# Patient Record
Sex: Male | Born: 1955 | Race: Black or African American | Hispanic: No | Marital: Married | Smoking: Never smoker
Health system: Southern US, Community
[De-identification: ages and names within clinical notes are randomized; demographics above are authoritative.]

## PROBLEM LIST (undated history)

## (undated) DIAGNOSIS — I10 Essential (primary) hypertension: Secondary | ICD-10-CM

## (undated) DIAGNOSIS — E7800 Pure hypercholesterolemia, unspecified: Secondary | ICD-10-CM

## (undated) DIAGNOSIS — N289 Disorder of kidney and ureter, unspecified: Secondary | ICD-10-CM

## (undated) HISTORY — PX: LYMPH GLAND EXCISION: SHX13

## (undated) HISTORY — PX: TOTAL HIP ARTHROPLASTY: SHX124

---

## 2012-01-14 ENCOUNTER — Emergency Department (HOSPITAL_COMMUNITY): Payer: BC Managed Care – PPO

## 2012-01-14 ENCOUNTER — Emergency Department (HOSPITAL_COMMUNITY)
Admission: EM | Admit: 2012-01-14 | Discharge: 2012-01-14 | Disposition: A | Discharge status: Home / Self Care | Payer: BC Managed Care – PPO | Attending: Emergency Medicine | Admitting: Emergency Medicine

## 2012-01-14 ENCOUNTER — Encounter (HOSPITAL_COMMUNITY): Payer: Self-pay | Admitting: *Deleted

## 2012-01-14 DIAGNOSIS — R109 Unspecified abdominal pain: Secondary | ICD-10-CM | POA: Insufficient documentation

## 2012-01-14 DIAGNOSIS — R110 Nausea: Secondary | ICD-10-CM | POA: Insufficient documentation

## 2012-01-14 DIAGNOSIS — N390 Urinary tract infection, site not specified: Secondary | ICD-10-CM | POA: Insufficient documentation

## 2012-01-14 DIAGNOSIS — R6883 Chills (without fever): Secondary | ICD-10-CM | POA: Insufficient documentation

## 2012-01-14 DIAGNOSIS — R10819 Abdominal tenderness, unspecified site: Secondary | ICD-10-CM | POA: Insufficient documentation

## 2012-01-14 DIAGNOSIS — Z87442 Personal history of urinary calculi: Secondary | ICD-10-CM | POA: Insufficient documentation

## 2012-01-14 HISTORY — DX: Disorder of kidney and ureter, unspecified: N28.9

## 2012-01-14 LAB — URINALYSIS, ROUTINE W REFLEX MICROSCOPIC
Bilirubin Urine: NEGATIVE
Glucose, UA: NEGATIVE mg/dL
Protein, ur: NEGATIVE mg/dL
Specific Gravity, Urine: 1.02 (ref 1.005–1.030)

## 2012-01-14 LAB — URINE MICROSCOPIC-ADD ON

## 2012-01-14 MED ORDER — CEFTRIAXONE SODIUM 1 G IJ SOLR
1.0000 g | Freq: Once | INTRAMUSCULAR | Status: AC
  Administered 2012-01-14: 1 g via INTRAMUSCULAR
  Filled 2012-01-14: qty 10

## 2012-01-14 MED ORDER — TRAMADOL HCL 50 MG PO TABS: 50.0000 mg | ORAL_TABLET | Freq: Four times a day (QID) | ORAL | Status: AC | PRN

## 2012-01-14 MED ORDER — CEPHALEXIN 500 MG PO CAPS: 500.0000 mg | ORAL_CAPSULE | Freq: Four times a day (QID) | ORAL | Status: AC

## 2012-01-14 MED ORDER — ONDANSETRON 4 MG PO TBDP
4.0000 mg | ORAL_TABLET | Freq: Once | ORAL | Status: AC
  Administered 2012-01-14: 4 mg via ORAL
  Filled 2012-01-14: qty 1

## 2012-01-14 MED ORDER — KETOROLAC TROMETHAMINE 30 MG/ML IJ SOLN: 30.0000 mg | Freq: Once | INTRAMUSCULAR | Status: DC

## 2012-01-14 MED ORDER — PROMETHAZINE HCL 25 MG PO TABS: 25.0000 mg | ORAL_TABLET | Freq: Four times a day (QID) | ORAL | Status: AC | PRN

## 2012-01-14 NOTE — Discharge Instructions (Signed)
Drink plenty of fluids.  Follow up with your md in 1-2 weeks.  Return if problems

## 2012-01-14 NOTE — ED Notes (Signed)
Left flank pain x 2 weeks. Hx of kidney stones, states that symptoms feel the same. Nausea.

## 2012-01-14 NOTE — ED Notes (Signed)
Attempted to start IV and draw labs. Pt refused stating "I dont want this to take very long. I just want an xray to tell me where the stone is and I want a shot to break it up." Primary RN Enid Derry RN notified.

## 2012-01-14 NOTE — ED Provider Notes (Cosign Needed)
History  Scribed for [name], MD, the patient was seen in room APA11/[facility]11. This chart was scribed by Candelaria Stagers. The patient's care started at 11:34 AM    CSN: [id number]  Arrival date & time 01/14/12  1044   First MD Initiated Contact with Patient 01/14/12 1121      Chief Complaint  Patient presents with  . Flank Pain     Patient is a 56 y.o. male presenting with flank pain. The history is provided by the patient.  Flank Pain This is a new problem. The current episode started more than 1 week ago. The problem occurs constantly. The problem has not changed since onset.Associated symptoms include abdominal pain. The symptoms are aggravated by nothing. The symptoms are relieved by nothing. He has tried nothing for the symptoms. The treatment provided no relief.   Jason Cole is a 56 y.o. male who presents to the Emergency Department complaining of constant flank pain on the left side that stated about two weeks ago.  He is also experiencing radiating pain to the abdomen, nausea, and chills.  He has h/o kidney stones and reports that these sx are similar.  Nothing seems to make the pain better or worse.      Past Medical History  Diagnosis Date  . Renal disorder     kidney stones.    Past Surgical History  Procedure Date  . Lymph gland excision     No family history on file.  History  Substance Use Topics  . Smoking status: Never Smoker   . Smokeless tobacco: Not on file  . Alcohol Use: No      Review of Systems  Constitutional: Positive for chills.  Gastrointestinal: Positive for nausea and abdominal pain.  Genitourinary: Positive for flank pain (left side).  All other systems reviewed and are negative.    Allergies  Review of patient's allergies indicates no known allergies.  Home Medications  No current outpatient prescriptions on file.  BP 152/85  Pulse 85  Temp(Src) 98.5 F (36.9 C) (Oral)  Resp 20  Ht 5\' 11"  (1.803 m)  Wt 225 lb  (102.059 kg)  BMI 31.38 kg/m2  SpO2 100%  Physical Exam  Nursing note and vitals reviewed. Constitutional: He is oriented to person, place, and time. He appears well-developed and well-nourished. No distress.  HENT:  Head: Normocephalic and atraumatic.  Eyes: EOM are normal. Right eye exhibits no discharge. Left eye exhibits no discharge.  Neck: Normal range of motion. Neck supple.  Pulmonary/Chest: Effort normal. No respiratory distress.  Abdominal:       Mild tenderness to right flank.   Musculoskeletal: He exhibits no edema and no tenderness.  Neurological: He is alert and oriented to person, place, and time.  Skin: Skin is warm and dry. He is not diaphoretic.  Psychiatric: He has a normal mood and affect. His behavior is normal.    ED Course  Procedures  DIAGNOSTIC STUDIES: Oxygen Saturation is 100% on room air, normal by my interpretation.    COORDINATION OF CARE: 11:39AM Ordered: URINALYSIS, ROUTINE W REFLEX MICROSCOPIC, BASIC METABOLIC PANEL, DIFFERENTIAL, CBC  1:23 PM Recheck: discussed results of test course of care with pt.  Pt did not want blood drawn.    No results found for this or any previous visit. Ct Abdomen Pelvis Wo Contrast  01/14/2012  *RADIOLOGY REPORT*  Clinical Data: 2-week history of left flank pain.  History of urinary tract calculi.  CT ABDOMEN AND PELVIS WITHOUT CONTRAST 01/14/2012:  Technique:  Multidetector CT imaging of the abdomen and pelvis was performed following the standard protocol without intravenous contrast.  Comparison: None.  Findings: No evidence of urinary tract calculi or obstruction on either side.  Slightly edematous left kidney relative to the right, with increased perinephric stranding/edema involving the left kidney.  Within the limits of the unenhanced technique, no focal parenchymal abnormality involving either kidney.  Normal low dose unenhanced appearance of the liver, spleen, pancreas, and adrenal glands.  Gallbladder contracted  as there is food within the normal-appearing stomach.  No biliary ductal dilation.  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  Scattered normal- sized retroperitoneal lymph nodes, the largest approximating 0.8 x 1.8 cm (series 2, image 36); no nodal masses.  Normal-appearing small bowel.  Moderate stool burden throughout normal appearing colon.  Normal appendix in the right upper pelvis.  No ascites.  Urinary bladder decompressed and unremarkable.  Numerous pelvic phleboliths.  Prostate gland and seminal vesicles normal for age. Bone window images demonstrate mild degenerative changes in the hips and mild lower thoracic spondylosis.  Visualized lung bases clear.  IMPRESSION:  1.  No evidence of urinary tract calculi or obstruction on either side. 2.  Edematous left kidney relative to the right.  Is there clinical evidence for pyelonephritis? 3.  No acute abnormalities otherwise. 4.  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  Original Report Authenticated By: Arnell Sieving, M.D.     Labs Reviewed  URINALYSIS, ROUTINE W REFLEX MICROSCOPIC - Abnormal; Notable for the following:    Hgb urine dipstick SMALL (*)    All other components within normal limits  URINE MICROSCOPIC-ADD ON - Abnormal; Notable for the following:    Bacteria, UA MANY (*)    All other components within normal limits   No results found.   No diagnosis found. Pt refused blood work   MDM  uti The chart was scribed for me under my direct supervision.  I personally performed the history, physical, and medical decision making and all procedures in the evaluation of this patient.[name], MD 01/14/12 1330

## 2012-01-14 NOTE — ED Notes (Signed)
Pt refused lab work, IV and pain medication again. Did agree to have CT

## 2012-01-16 LAB — URINE CULTURE

## 2012-01-17 NOTE — ED Notes (Signed)
+   Urine Patient treated with Keflex-sensitive to same-chart appended per protocol MD. 

## 2017-07-22 ENCOUNTER — Encounter (HOSPITAL_COMMUNITY): Payer: Self-pay | Admitting: Emergency Medicine

## 2017-07-22 ENCOUNTER — Emergency Department (HOSPITAL_COMMUNITY)
Admission: EM | Admit: 2017-07-22 | Discharge: 2017-07-22 | Disposition: A | Discharge status: Home / Self Care | Payer: 59 | Attending: Emergency Medicine | Admitting: Emergency Medicine

## 2017-07-22 DIAGNOSIS — Z79899 Other long term (current) drug therapy: Secondary | ICD-10-CM | POA: Insufficient documentation

## 2017-07-22 DIAGNOSIS — T7840XA Allergy, unspecified, initial encounter: Secondary | ICD-10-CM | POA: Insufficient documentation

## 2017-07-22 DIAGNOSIS — R600 Localized edema: Secondary | ICD-10-CM | POA: Diagnosis present

## 2017-07-22 DIAGNOSIS — Z7902 Long term (current) use of antithrombotics/antiplatelets: Secondary | ICD-10-CM | POA: Insufficient documentation

## 2017-07-22 DIAGNOSIS — I10 Essential (primary) hypertension: Secondary | ICD-10-CM

## 2017-07-22 DIAGNOSIS — H1033 Unspecified acute conjunctivitis, bilateral: Secondary | ICD-10-CM | POA: Insufficient documentation

## 2017-07-22 HISTORY — DX: Pure hypercholesterolemia, unspecified: E78.00

## 2017-07-22 HISTORY — DX: Essential (primary) hypertension: I10

## 2017-07-22 MED ORDER — FAMOTIDINE IN NACL 20-0.9 MG/50ML-% IV SOLN
20.0000 mg | Freq: Once | INTRAVENOUS | Status: AC
  Administered 2017-07-22: 20 mg via INTRAVENOUS
  Filled 2017-07-22: qty 50

## 2017-07-22 MED ORDER — AMLODIPINE BESYLATE 5 MG PO TABS: 5.0000 mg | ORAL_TABLET | Freq: Every day | ORAL | 1 refills | Status: AC

## 2017-07-22 MED ORDER — SODIUM CHLORIDE 0.9 % IV BOLUS (SEPSIS)
1000.0000 mL | Freq: Once | INTRAVENOUS | Status: AC
  Administered 2017-07-22: 1000 mL via INTRAVENOUS

## 2017-07-22 MED ORDER — METHYLPREDNISOLONE SODIUM SUCC 125 MG IJ SOLR
125.0000 mg | Freq: Once | INTRAMUSCULAR | Status: AC
  Administered 2017-07-22: 125 mg via INTRAVENOUS
  Filled 2017-07-22: qty 2

## 2017-07-22 MED ORDER — DIPHENHYDRAMINE HCL 50 MG/ML IJ SOLN
25.0000 mg | Freq: Once | INTRAMUSCULAR | Status: AC
  Administered 2017-07-22: 25 mg via INTRAVENOUS
  Filled 2017-07-22: qty 1

## 2017-07-22 MED ORDER — TOBRAMYCIN 0.3 % OP SOLN
2.0000 [drp] | Freq: Once | OPHTHALMIC | Status: AC
  Administered 2017-07-22: 2 [drp] via OPHTHALMIC
  Filled 2017-07-22: qty 5

## 2017-07-22 NOTE — Discharge Instructions (Signed)
Stop lisinopril. Start new blood pressure medication. For your eyes, rinse with tap water, then apply drops. Follow-up your primary care doctor or return if worse

## 2017-07-22 NOTE — ED Notes (Signed)
ED Provider at bedside. 

## 2017-07-22 NOTE — ED Triage Notes (Signed)
Pt reports bilateral eyelid swelling yesterday and upper lip swelling since this am. Airway patent. nad noted.

## 2017-07-23 NOTE — ED Provider Notes (Signed)
[facility]-EMERGENCY DEPT Provider Note   CSN: [id number] Arrival date & time: 07/22/17  1018     History   Chief Complaint Chief Complaint  Patient presents with  . Oral Swelling    HPI Jason Cole is a 61 y.o. male.  Complains of upper lip swelling and bilateral eye redness with crusty discharge for the past 24 hrs. Patient is on lisinopril for his blood pressure.  No other known allergens or inciting events. Normal swallowing. Normal airway. Respirations normal.  Severity of symptoms is moderate. Nothing makes symptoms better or worse.      Past Medical History:  Diagnosis Date  . High cholesterol   . Hypertension   . Renal disorder    kidney stones.    There are no active problems to display for this patient.   Past Surgical History:  Procedure Laterality Date  . LYMPH GLAND EXCISION         Home Medications    Prior to Admission medications   Medication Sig Start Date End Date Taking? Authorizing Provider  aspirin EC 81 MG tablet Take 81 mg by mouth daily as needed for moderate pain.    Yes [provider]  ibuprofen (ADVIL,MOTRIN) 200 MG tablet Take 200-400 mg by mouth every 6 (six) hours as needed for moderate pain.   Yes [provider]  lisinopril-hydrochlorothiazide (PRINZIDE,ZESTORETIC) 10-12.5 MG tablet Take 1 tablet by mouth daily.   Yes [provider]  Multiple Vitamins-Minerals (ONE-A-DAY 50 PLUS PO) Take 1 tablet by mouth daily.   Yes [provider]  pravastatin (PRAVACHOL) 40 MG tablet Take 40 mg by mouth daily.   Yes [provider]  amLODipine (NORVASC) 5 MG tablet Take 1 tablet (5 mg total) by mouth daily. 07/22/17   Donnetta Hutching, MD    Family History History reviewed. No pertinent family history.  Social History Social History  Substance Use Topics  . Smoking status: Never Smoker  . Smokeless tobacco: Never Used  . Alcohol use No     Allergies   Patient has no known  allergies.   Review of Systems Review of Systems  All other systems reviewed and are negative.    Physical Exam Updated Vital Signs BP 113/74   Pulse 60   Temp 98.9 F (37.2 C) (Oral)   Resp 18   Ht  (1.803 m)   Wt 97.5 kg (215 lb)   SpO2 98%   BMI 29.99 kg/m   Physical Exam  Constitutional: He is oriented to person, place, and time. He appears well-developed and well-nourished.  HENT:  Significant upper lip swelling  Eyes:  Conjunctiva red, inflamed:  Crusty discharge  Neck: Neck supple.  Cardiovascular: Normal rate and regular rhythm.   Pulmonary/Chest: Effort normal and breath sounds normal.  Abdominal: Soft. Bowel sounds are normal.  Musculoskeletal: Normal range of motion.  Neurological: He is alert and oriented to person, place, and time.  Skin: Skin is warm and dry.  Psychiatric: He has a normal mood and affect. His behavior is normal.  Nursing note and vitals reviewed.    ED Treatments / Results  Labs (all labs ordered are listed, but only abnormal results are displayed) Labs Reviewed - No data to display  EKG  EKG Interpretation None       Radiology No results found.  Procedures Procedures (including critical care time)  Medications Ordered in ED Medications  sodium chloride 0.9 % bolus 1,000 mL (0 mLs Intravenous Stopped 07/22/17 1338)  methylPREDNISolone  sodium succinate (SOLU-MEDROL) 125 mg/2 mL injection 125 mg (125 mg Intravenous Given 07/22/17 1100)  famotidine (PEPCID) IVPB 20 mg premix (0 mg Intravenous Stopped 07/22/17 1148)  diphenhydrAMINE (BENADRYL) injection 25 mg (25 mg Intravenous Given 07/22/17 1100)  tobramycin (TOBREX) 0.3 % ophthalmic solution 2 drop (2 drops Both Eyes Given 07/22/17 1100)     Initial Impression / Assessment and Plan / ED Course  I have reviewed the triage vital signs and the nursing notes.  Pertinent labs & imaging results that were available during my care of the patient were reviewed by me and  considered in my medical decision making (see chart for details).     Suspect upper lip swelling secondary to ACE inhibitor. Patient was given the usual cocktails of steroids, Benadryl, Pepcid. Will discontinue Lisinopril and start a new antihypertensive Norvasc.  Additionally, will Rx for suspected severe conjunctivitis with tobramycin ophthalmic.  Final Clinical Impressions(s) / ED Diagnoses   Final diagnoses:  Allergic reaction, initial encounter  Hypertension, unspecified type  Acute conjunctivitis of both eyes, unspecified acute conjunctivitis type    New Prescriptions Discharge Medication List as of 07/22/2017  2:15 PM    START taking these medications   Details  amLODipine (NORVASC) 5 MG tablet Take 1 tablet (5 mg total) by mouth daily., Starting Sat 07/22/2017, Print         Donnetta Hutching, MD 07/23/17 [phone]

## 2017-08-07 DIAGNOSIS — Z23 Encounter for immunization: Secondary | ICD-10-CM | POA: Diagnosis not present

## 2017-12-11 DIAGNOSIS — I10 Essential (primary) hypertension: Secondary | ICD-10-CM | POA: Diagnosis not present

## 2017-12-11 DIAGNOSIS — E782 Mixed hyperlipidemia: Secondary | ICD-10-CM | POA: Diagnosis not present

## 2017-12-11 DIAGNOSIS — Z6831 Body mass index (BMI) 31.0-31.9, adult: Secondary | ICD-10-CM | POA: Diagnosis not present

## 2017-12-11 DIAGNOSIS — R0683 Snoring: Secondary | ICD-10-CM | POA: Diagnosis not present

## 2017-12-11 DIAGNOSIS — E6609 Other obesity due to excess calories: Secondary | ICD-10-CM | POA: Diagnosis not present

## 2017-12-12 DIAGNOSIS — I10 Essential (primary) hypertension: Secondary | ICD-10-CM | POA: Diagnosis not present

## 2017-12-12 DIAGNOSIS — R0683 Snoring: Secondary | ICD-10-CM | POA: Diagnosis not present

## 2017-12-12 DIAGNOSIS — E782 Mixed hyperlipidemia: Secondary | ICD-10-CM | POA: Diagnosis not present

## 2017-12-12 DIAGNOSIS — Z6831 Body mass index (BMI) 31.0-31.9, adult: Secondary | ICD-10-CM | POA: Diagnosis not present

## 2017-12-14 DIAGNOSIS — R0683 Snoring: Secondary | ICD-10-CM | POA: Diagnosis not present

## 2018-01-16 DIAGNOSIS — G4733 Obstructive sleep apnea (adult) (pediatric): Secondary | ICD-10-CM | POA: Diagnosis not present

## 2018-01-17 DIAGNOSIS — G4733 Obstructive sleep apnea (adult) (pediatric): Secondary | ICD-10-CM | POA: Diagnosis not present

## 2018-02-02 DIAGNOSIS — G4733 Obstructive sleep apnea (adult) (pediatric): Secondary | ICD-10-CM | POA: Diagnosis not present

## 2018-03-04 DIAGNOSIS — G4733 Obstructive sleep apnea (adult) (pediatric): Secondary | ICD-10-CM | POA: Diagnosis not present

## 2018-04-04 DIAGNOSIS — G4733 Obstructive sleep apnea (adult) (pediatric): Secondary | ICD-10-CM | POA: Diagnosis not present

## 2018-05-21 DIAGNOSIS — E6609 Other obesity due to excess calories: Secondary | ICD-10-CM | POA: Diagnosis not present

## 2018-05-21 DIAGNOSIS — I10 Essential (primary) hypertension: Secondary | ICD-10-CM | POA: Diagnosis not present

## 2018-05-21 DIAGNOSIS — Z6831 Body mass index (BMI) 31.0-31.9, adult: Secondary | ICD-10-CM | POA: Diagnosis not present

## 2018-05-21 DIAGNOSIS — Z1389 Encounter for screening for other disorder: Secondary | ICD-10-CM | POA: Diagnosis not present

## 2018-09-05 ENCOUNTER — Other Ambulatory Visit (HOSPITAL_COMMUNITY): Payer: Self-pay | Admitting: Family Medicine

## 2018-09-05 ENCOUNTER — Ambulatory Visit (HOSPITAL_COMMUNITY)
Admission: RE | Admit: 2018-09-05 | Discharge: 2018-09-05 | Disposition: A | Discharge status: Home / Self Care | Payer: BLUE CROSS/BLUE SHIELD | Source: Ambulatory Visit | Attending: Family Medicine | Admitting: Family Medicine

## 2018-09-05 DIAGNOSIS — M25552 Pain in left hip: Secondary | ICD-10-CM | POA: Diagnosis not present

## 2018-09-05 DIAGNOSIS — M25551 Pain in right hip: Secondary | ICD-10-CM | POA: Diagnosis not present

## 2018-09-05 DIAGNOSIS — Z23 Encounter for immunization: Secondary | ICD-10-CM | POA: Diagnosis not present

## 2018-09-05 DIAGNOSIS — M160 Bilateral primary osteoarthritis of hip: Secondary | ICD-10-CM | POA: Diagnosis not present

## 2018-09-05 DIAGNOSIS — Z6831 Body mass index (BMI) 31.0-31.9, adult: Secondary | ICD-10-CM | POA: Diagnosis not present

## 2018-09-05 DIAGNOSIS — Z1389 Encounter for screening for other disorder: Secondary | ICD-10-CM | POA: Diagnosis not present

## 2018-09-05 DIAGNOSIS — E6609 Other obesity due to excess calories: Secondary | ICD-10-CM | POA: Diagnosis not present

## 2018-12-17 DIAGNOSIS — M5412 Radiculopathy, cervical region: Secondary | ICD-10-CM | POA: Diagnosis not present

## 2018-12-17 DIAGNOSIS — Z6831 Body mass index (BMI) 31.0-31.9, adult: Secondary | ICD-10-CM | POA: Diagnosis not present

## 2018-12-17 DIAGNOSIS — Z1389 Encounter for screening for other disorder: Secondary | ICD-10-CM | POA: Diagnosis not present

## 2018-12-17 DIAGNOSIS — E6609 Other obesity due to excess calories: Secondary | ICD-10-CM | POA: Diagnosis not present

## 2018-12-17 DIAGNOSIS — L910 Hypertrophic scar: Secondary | ICD-10-CM | POA: Diagnosis not present

## 2019-01-07 DIAGNOSIS — L910 Hypertrophic scar: Secondary | ICD-10-CM | POA: Diagnosis not present

## 2019-01-31 ENCOUNTER — Ambulatory Visit: Payer: BLUE CROSS/BLUE SHIELD

## 2019-01-31 ENCOUNTER — Ambulatory Visit
Admission: RE | Admit: 2019-01-31 | Discharge: 2019-01-31 | Disposition: A | Discharge status: Home / Self Care | Payer: BLUE CROSS/BLUE SHIELD | Source: Ambulatory Visit | Attending: Radiation Oncology | Admitting: Radiation Oncology

## 2019-02-20 DIAGNOSIS — I10 Essential (primary) hypertension: Secondary | ICD-10-CM | POA: Diagnosis not present

## 2019-02-20 DIAGNOSIS — Z1389 Encounter for screening for other disorder: Secondary | ICD-10-CM | POA: Diagnosis not present

## 2019-02-20 DIAGNOSIS — D649 Anemia, unspecified: Secondary | ICD-10-CM | POA: Diagnosis not present

## 2019-02-20 DIAGNOSIS — M2550 Pain in unspecified joint: Secondary | ICD-10-CM | POA: Diagnosis not present

## 2019-02-20 DIAGNOSIS — Z681 Body mass index (BMI) 19 or less, adult: Secondary | ICD-10-CM | POA: Diagnosis not present

## 2019-02-20 DIAGNOSIS — M1991 Primary osteoarthritis, unspecified site: Secondary | ICD-10-CM | POA: Diagnosis not present

## 2019-02-20 DIAGNOSIS — E7849 Other hyperlipidemia: Secondary | ICD-10-CM | POA: Diagnosis not present

## 2019-08-02 DIAGNOSIS — Z23 Encounter for immunization: Secondary | ICD-10-CM | POA: Diagnosis not present

## 2019-08-20 DIAGNOSIS — M1611 Unilateral primary osteoarthritis, right hip: Secondary | ICD-10-CM | POA: Diagnosis not present

## 2019-08-20 DIAGNOSIS — Z6833 Body mass index (BMI) 33.0-33.9, adult: Secondary | ICD-10-CM | POA: Diagnosis not present

## 2019-08-20 DIAGNOSIS — G5681 Other specified mononeuropathies of right upper limb: Secondary | ICD-10-CM | POA: Diagnosis not present

## 2019-08-20 DIAGNOSIS — I10 Essential (primary) hypertension: Secondary | ICD-10-CM | POA: Diagnosis not present

## 2019-09-04 ENCOUNTER — Emergency Department (HOSPITAL_COMMUNITY): Payer: BC Managed Care – PPO

## 2019-09-04 ENCOUNTER — Other Ambulatory Visit: Payer: Self-pay

## 2019-09-04 ENCOUNTER — Encounter (HOSPITAL_COMMUNITY): Payer: Self-pay | Admitting: *Deleted

## 2019-09-04 ENCOUNTER — Emergency Department (HOSPITAL_COMMUNITY)
Admission: EM | Admit: 2019-09-04 | Discharge: 2019-09-04 | Disposition: A | Discharge status: Home / Self Care | Payer: BC Managed Care – PPO | Attending: Emergency Medicine | Admitting: Emergency Medicine

## 2019-09-04 DIAGNOSIS — Z79899 Other long term (current) drug therapy: Secondary | ICD-10-CM | POA: Insufficient documentation

## 2019-09-04 DIAGNOSIS — R0789 Other chest pain: Secondary | ICD-10-CM

## 2019-09-04 DIAGNOSIS — U071 COVID-19: Secondary | ICD-10-CM | POA: Diagnosis not present

## 2019-09-04 DIAGNOSIS — R079 Chest pain, unspecified: Secondary | ICD-10-CM | POA: Diagnosis not present

## 2019-09-04 DIAGNOSIS — I10 Essential (primary) hypertension: Secondary | ICD-10-CM | POA: Insufficient documentation

## 2019-09-04 LAB — BASIC METABOLIC PANEL
Anion gap: 7 (ref 5–15)
BUN: 16 mg/dL (ref 8–23)
CO2: 25 mmol/L (ref 22–32)
Calcium: 9 mg/dL (ref 8.9–10.3)
Chloride: 106 mmol/L (ref 98–111)
Creatinine, Ser: 0.96 mg/dL (ref 0.61–1.24)
GFR calc Af Amer: >60 mL/min (ref >60)
GFR calc non Af Amer: >60 mL/min (ref >60)
Glucose, Bld: 95 mg/dL (ref 70–99)
Potassium: 4 mmol/L (ref 3.5–5.1)
Sodium: 138 mmol/L (ref 135–145)

## 2019-09-04 LAB — CBC
HCT: 44.1 % (ref 39.0–52.0)
Hemoglobin: 13.6 g/dL (ref 13.0–17.0)
MCH: 23 pg — ABNORMAL LOW (ref 26.0–34.0)
MCHC: 30.8 g/dL (ref 30.0–36.0)
MCV: 74.6 fL — ABNORMAL LOW (ref 80.0–100.0)
Platelets: 184 10*3/uL (ref 150–400)
RBC: 5.91 MIL/uL — ABNORMAL HIGH (ref 4.22–5.81)
RDW: 14.5 % (ref 11.5–15.5)
WBC: 4.9 10*3/uL (ref 4.0–10.5)
nRBC: 0 % (ref 0.0–0.2)

## 2019-09-04 LAB — TROPONIN I (HIGH SENSITIVITY): Troponin I (High Sensitivity): 3 ng/L (ref <18)

## 2019-09-04 MED ORDER — IBUPROFEN 400 MG PO TABS
400.0000 mg | ORAL_TABLET | Freq: Once | ORAL | Status: AC
  Administered 2019-09-04: 400 mg via ORAL
  Filled 2019-09-04: qty 1

## 2019-09-04 MED ORDER — SODIUM CHLORIDE 0.9% FLUSH: 3.0000 mL | Freq: Once | INTRAVENOUS | Status: DC

## 2019-09-04 NOTE — Discharge Instructions (Addendum)
It was our pleasure to provide your ER care today - we hope that you feel better.  Take ibuprofen and/or acetaminophen as need.   Your chest xray was read as showing a 'convex opacity' at the left lung base, and our radiologist recommends a follow up outpatient CT scan of the chest to further evaluate - follow up with primary care doctor in the next 1-2 weeks, have them review your chest xray and arrange this follow up imaging.   Return to ER if worse, new symptoms, increased trouble breathing, persistent or recurrent chest pain, or other concern.

## 2019-09-04 NOTE — ED Triage Notes (Signed)
Pt in c/o mid CP onset 2-3 days, pt reports recent + COVID test last weds, pt denies n/v/d, pt denies SOB, pt A&O x4

## 2019-09-04 NOTE — ED Notes (Signed)
Pt updated via phone re: continued efforts for a room, pt verbalizes understanding

## 2019-09-04 NOTE — ED Provider Notes (Signed)
[facility] EMERGENCY DEPARTMENT Provider Note   CSN: [id number] Arrival date & time: 09/04/19  1513     History   Chief Complaint Chief Complaint  Patient presents with  . Chest Pain    HPI Luiz Trumpower is a 63 y.o. male.     Patient with recent dx covid+, presents with chest tightness in past several days. Symptoms constant, mild-mod, occur at rest, no relation to exertion or specific position. Not pleuritic. No exertional cp or discomfort. +non productive cough persists, without abrupt or acute worsening. No sore throat. Fevers have improved/resolved. covid test was positive 1 week ago, notes symptom onset 2-3 days before that. Denies leg pain or swelling. No recent immobility, surgery, or trauma. No hx cad. No fam hx premature cad. No hx dvt or pe.   The history is provided by the patient.  Chest Pain Associated symptoms: cough   Associated symptoms: no abdominal pain, no back pain, no diaphoresis, no headache, no palpitations, no shortness of breath and no vomiting     Past Medical History:  Diagnosis Date  . High cholesterol   . Hypertension   . Renal disorder    kidney stones.    There are no active problems to display for this patient.   Past Surgical History:  Procedure Laterality Date  . LYMPH GLAND EXCISION          Home Medications    Prior to Admission medications   Medication Sig Start Date End Date Taking? Authorizing Provider  amLODipine (NORVASC) 5 MG tablet Take 1 tablet (5 mg total) by mouth daily. 07/22/17   Nat Christen, MD  aspirin EC 81 MG tablet Take 81 mg by mouth daily as needed for moderate pain.     [provider]  celecoxib (CELEBREX) 200 MG capsule Take 200 mg by mouth daily as needed. 08/13/19   [provider]  gabapentin (NEURONTIN) 300 MG capsule Take 300 mg by mouth 3 (three) times daily. 08/20/19   [provider]  ibuprofen (ADVIL,MOTRIN) 200 MG tablet Take 200-400 mg by mouth every 6 (six) hours  as needed for moderate pain.    [provider]  lisinopril-hydrochlorothiazide (PRINZIDE,ZESTORETIC) 10-12.5 MG tablet Take 1 tablet by mouth daily.    [provider]  Multiple Vitamins-Minerals (ONE-A-DAY 50 PLUS PO) Take 1 tablet by mouth daily.    [provider]  pravastatin (PRAVACHOL) 40 MG tablet Take 40 mg by mouth daily.    [provider]    Family History No family history on file.  Social History Social History   Tobacco Use  . Smoking status: Never Smoker  . Smokeless tobacco: Never Used  Substance Use Topics  . Alcohol use: No  . Drug use: Not on file     Allergies   Patient has no known allergies.   Review of Systems Review of Systems  Constitutional: Negative for chills and diaphoresis.  HENT: Negative for sore throat.   Eyes: Negative for discharge and redness.  Respiratory: Positive for cough. Negative for shortness of breath.   Cardiovascular: Positive for chest pain. Negative for palpitations and leg swelling.  Gastrointestinal: Negative for abdominal pain, diarrhea and vomiting.  Genitourinary: Negative for flank pain.  Musculoskeletal: Negative for back pain and neck pain.  Skin: Negative for rash.  Neurological: Negative for headaches.  Hematological: Does not bruise/bleed easily.  Psychiatric/Behavioral: Negative for confusion.     Physical Exam Updated Vital Signs BP (!) 154/88 (BP Location: Right Arm)  Pulse 69   Temp (!) 97.3 F (36.3 C) (Oral)   Resp 14   Ht 1.816 m (5' 11.5")   SpO2 100%   BMI 29.57 kg/m   Physical Exam Vitals signs and nursing note reviewed.  Constitutional:      Appearance: Normal appearance. He is well-developed.  HENT:     Head: Atraumatic.     Nose: Nose normal.     Mouth/Throat:     Mouth: Mucous membranes are moist.     Pharynx: Oropharynx is clear.  Eyes:     General: No scleral icterus.    Conjunctiva/sclera: Conjunctivae normal.  Neck:     Musculoskeletal:  Normal range of motion and neck supple. No neck rigidity.     Trachea: No tracheal deviation.  Cardiovascular:     Rate and Rhythm: Normal rate and regular rhythm.     Pulses: Normal pulses.     Heart sounds: Normal heart sounds. No murmur. No friction rub. No gallop.   Pulmonary:     Effort: Pulmonary effort is normal. No accessory muscle usage or respiratory distress.     Breath sounds: Normal breath sounds.     Comments: Mild anterior chest wall tenderness reproducing patients symptoms. No crepitus. Normal chest wall movement.  Abdominal:     General: Bowel sounds are normal. There is no distension.     Palpations: Abdomen is soft.     Tenderness: There is no abdominal tenderness. There is no guarding.  Genitourinary:    Comments: No cva tenderness. Musculoskeletal:        General: No swelling or tenderness.     Right lower leg: No edema.     Left lower leg: No edema.  Skin:    General: Skin is warm and dry.     Findings: No rash.  Neurological:     Mental Status: He is alert.     Comments: Alert, speech clear.   Psychiatric:        Mood and Affect: Mood normal.      ED Treatments / Results  Labs (all labs ordered are listed, but only abnormal results are displayed) Results for orders placed or performed during the hospital encounter of 09/04/19  Basic metabolic panel  Result Value Ref Range   Sodium 138 135 - 145 mmol/L   Potassium 4.0 3.5 - 5.1 mmol/L   Chloride 106 98 - 111 mmol/L   CO2 25 22 - 32 mmol/L   Glucose, Bld 95 70 - 99 mg/dL   BUN 16 8 - 23 mg/dL   Creatinine, Ser 4.03 0.61 - 1.24 mg/dL   Calcium 9.0 8.9 - 47.4 mg/dL   GFR calc non Af Amer >60 >60 mL/min   GFR calc Af Amer >60 >60 mL/min   Anion gap 7 5 - 15  CBC  Result Value Ref Range   WBC 4.9 4.0 - 10.5 K/uL   RBC 5.91 (H) 4.22 - 5.81 MIL/uL   Hemoglobin 13.6 13.0 - 17.0 g/dL   HCT 25.9 56.3 - 87.5 %   MCV 74.6 (L) 80.0 - 100.0 fL   MCH 23.0 (L) 26.0 - 34.0 pg   MCHC 30.8 30.0 - 36.0 g/dL    RDW 64.3 32.9 - 51.8 %   Platelets 184 150 - 400 K/uL   nRBC 0.0 0.0 - 0.2 %  Troponin I (High Sensitivity)  Result Value Ref Range   Troponin I (High Sensitivity) 3 <18 ng/L   Dg Chest [facility] 1 [address]  Result Date: 09/04/2019 CLINICAL DATA:  Chest pain EXAM: PORTABLE CHEST 1 VIEW COMPARISON:  None. FINDINGS: Right lung is clear. Convex opacity at the left CP angle. Normal heart size. No pneumothorax. IMPRESSION: Convex opacity at the left CP angle, may reflect pleural lesion or small amount of loculated pleural fluid. CT chest could be obtained if further imaging is desired. Electronically Signed   By: Jasmine PangKim  Fujinaga M.D.   On: 09/04/2019 19:28    EKG EKG Interpretation  Date/Time:  Wednesday September 04 2019 15:28:12 EST Ventricular Rate:  67 PR Interval:  182 QRS Duration: 90 QT Interval:  392 QTC Calculation: 414 R Axis:   -20 Text Interpretation: Normal sinus rhythm Nonspecific T wave abnormality No previous tracing Confirmed by Cathren LaineSteinl, Mildred Tuccillo ([id number]) on 09/04/2019 5:57:25 PM   Radiology Dg Chest Port 1 View  Result Date: 09/04/2019 CLINICAL DATA:  Chest pain EXAM: PORTABLE CHEST 1 VIEW COMPARISON:  None. FINDINGS: Right lung is clear. Convex opacity at the left CP angle. Normal heart size. No pneumothorax. IMPRESSION: Convex opacity at the left CP angle, may reflect pleural lesion or small amount of loculated pleural fluid. CT chest could be obtained if further imaging is desired. Electronically Signed   By: Jasmine PangKim  Fujinaga M.D.   On: 09/04/2019 19:28    Procedures Procedures (including critical care time)  Medications Ordered in ED Medications  sodium chloride flush (NS) 0.9 % injection 3 mL (has no administration in time range)     Initial Impression / Assessment and Plan / ED Course  I have reviewed the triage vital signs and the nursing notes.  Pertinent labs & imaging results that were available during my care of the patient were reviewed by me and considered in my  medical decision making (see chart for details).  Ecg. Continuous pulse ox and monitoring. Labs sent. Cxr.   Reviewed nursing notes and prior charts for additional history.   Labs reviewed/interpreted by me - chem normal, wbc normal. Troponin is normal.   CXR reviewed/interpreted by me - no pna.   Recheck pt, +chest wall tenderness. No increased wob. There is no lateralizing and/or pleuritic pain. No sob. No exertional cp. After symptoms present for past 2+ days, trop is normal - felt not c/w acs.   Ibuprofen po.  Pt currently appears comfortable, and in no acute pain, and with no increased wob. bp is 137/86, pulse 60, pulse ox 98-100% on room air.   Pt currently appears stable for d/c.  Return precautions provided.   Final Clinical Impressions(s) / ED Diagnoses   Final diagnoses:  Chest pain    ED Discharge Orders    None       Cathren LaineSteinl, Jhon Mallozzi, MD 09/04/19 2103

## 2019-09-04 NOTE — ED Notes (Signed)
Spoke with Agricultural consultant re: where to have pt wait for room, per charge the pt may wait in his vehicle and be notified via phone once a room is available, pt in agreement with plan

## 2019-09-04 NOTE — ED Notes (Signed)
In to see pt and place IV and place pt on monitor. Lab had just drawn pt's blood work. Pt does not want an IV at this time. States he felt "much better" than he has in previous 2 days. States he does not have chest pain but only "pressure". Pt connected to all monitoring equipment, urinal placed at bedside, and pt given call bell to use as needed. Will continue to monitor.

## 2020-01-17 ENCOUNTER — Ambulatory Visit: Payer: BC Managed Care – PPO | Attending: Internal Medicine

## 2020-01-17 DIAGNOSIS — Z23 Encounter for immunization: Secondary | ICD-10-CM

## 2020-01-17 NOTE — Progress Notes (Signed)
   Covid-19 Vaccination Clinic  Name:  Jason Cole    MRN: [id number] DOB: 1956/02/15  01/17/2020  [name] was observed post Covid-19 immunization for 15 minutes without incident. He was provided with Vaccine Information Sheet and instruction to access the V-Safe system.   [name] was instructed to call 911 with any severe reactions post vaccine: Marland Kitchen Difficulty breathing  . Swelling of face and throat  . A fast heartbeat  . A bad rash all over body  . Dizziness and weakness   Immunizations Administered    Name Date Dose VIS Date Route   Moderna COVID-19 Vaccine 01/17/2020  9:56 AM 0.5 mL 10/01/2019 Intramuscular   Manufacturer: Moderna   Lot: [id number]   NDC: 55974-163-84

## 2020-02-18 ENCOUNTER — Ambulatory Visit: Payer: BC Managed Care – PPO | Attending: Internal Medicine

## 2020-02-18 DIAGNOSIS — Z23 Encounter for immunization: Secondary | ICD-10-CM

## 2020-02-18 NOTE — Progress Notes (Signed)
   Covid-19 Vaccination Clinic  Name:  Jason Cole    MRN: [id number] DOB: Nov 04, 1955  02/18/2020  Mr. [name] was observed post Covid-19 immunization for 15 minutes without incident. He was provided with Vaccine Information Sheet and instruction to access the V-Safe system.   Mr. [name] was instructed to call 911 with any severe reactions post vaccine: Marland Kitchen Difficulty breathing  . Swelling of face and throat  . A fast heartbeat  . A bad rash all over body  . Dizziness and weakness   Immunizations Administered    Name Date Dose VIS Date Route   Moderna COVID-19 Vaccine 02/18/2020  9:01 AM 0.5 mL 10/2019 Intramuscular   Manufacturer: Moderna   Lot: [id number]   NDC: 26834-196-22

## 2020-03-10 DIAGNOSIS — E7849 Other hyperlipidemia: Secondary | ICD-10-CM | POA: Diagnosis not present

## 2020-03-10 DIAGNOSIS — Z125 Encounter for screening for malignant neoplasm of prostate: Secondary | ICD-10-CM | POA: Diagnosis not present

## 2020-03-10 DIAGNOSIS — I10 Essential (primary) hypertension: Secondary | ICD-10-CM | POA: Diagnosis not present

## 2020-03-10 DIAGNOSIS — Z6833 Body mass index (BMI) 33.0-33.9, adult: Secondary | ICD-10-CM | POA: Diagnosis not present

## 2020-03-10 DIAGNOSIS — M2550 Pain in unspecified joint: Secondary | ICD-10-CM | POA: Diagnosis not present

## 2020-03-10 DIAGNOSIS — Z79899 Other long term (current) drug therapy: Secondary | ICD-10-CM | POA: Diagnosis not present

## 2020-03-10 DIAGNOSIS — M1991 Primary osteoarthritis, unspecified site: Secondary | ICD-10-CM | POA: Diagnosis not present

## 2020-03-10 DIAGNOSIS — Z1389 Encounter for screening for other disorder: Secondary | ICD-10-CM | POA: Diagnosis not present

## 2020-06-11 DIAGNOSIS — Z6833 Body mass index (BMI) 33.0-33.9, adult: Secondary | ICD-10-CM | POA: Diagnosis not present

## 2020-06-11 DIAGNOSIS — M1991 Primary osteoarthritis, unspecified site: Secondary | ICD-10-CM | POA: Diagnosis not present

## 2020-06-11 DIAGNOSIS — E6609 Other obesity due to excess calories: Secondary | ICD-10-CM | POA: Diagnosis not present

## 2020-06-11 DIAGNOSIS — I10 Essential (primary) hypertension: Secondary | ICD-10-CM | POA: Diagnosis not present

## 2020-06-11 DIAGNOSIS — G894 Chronic pain syndrome: Secondary | ICD-10-CM | POA: Diagnosis not present

## 2020-07-05 IMAGING — DX DG CHEST 1V PORT
1 series · 1 of 1 positions shown · non-contrast
Comparison: None.

CLINICAL DATA: Chest pain

EXAM:
PORTABLE CHEST 1 VIEW

[chest ap]
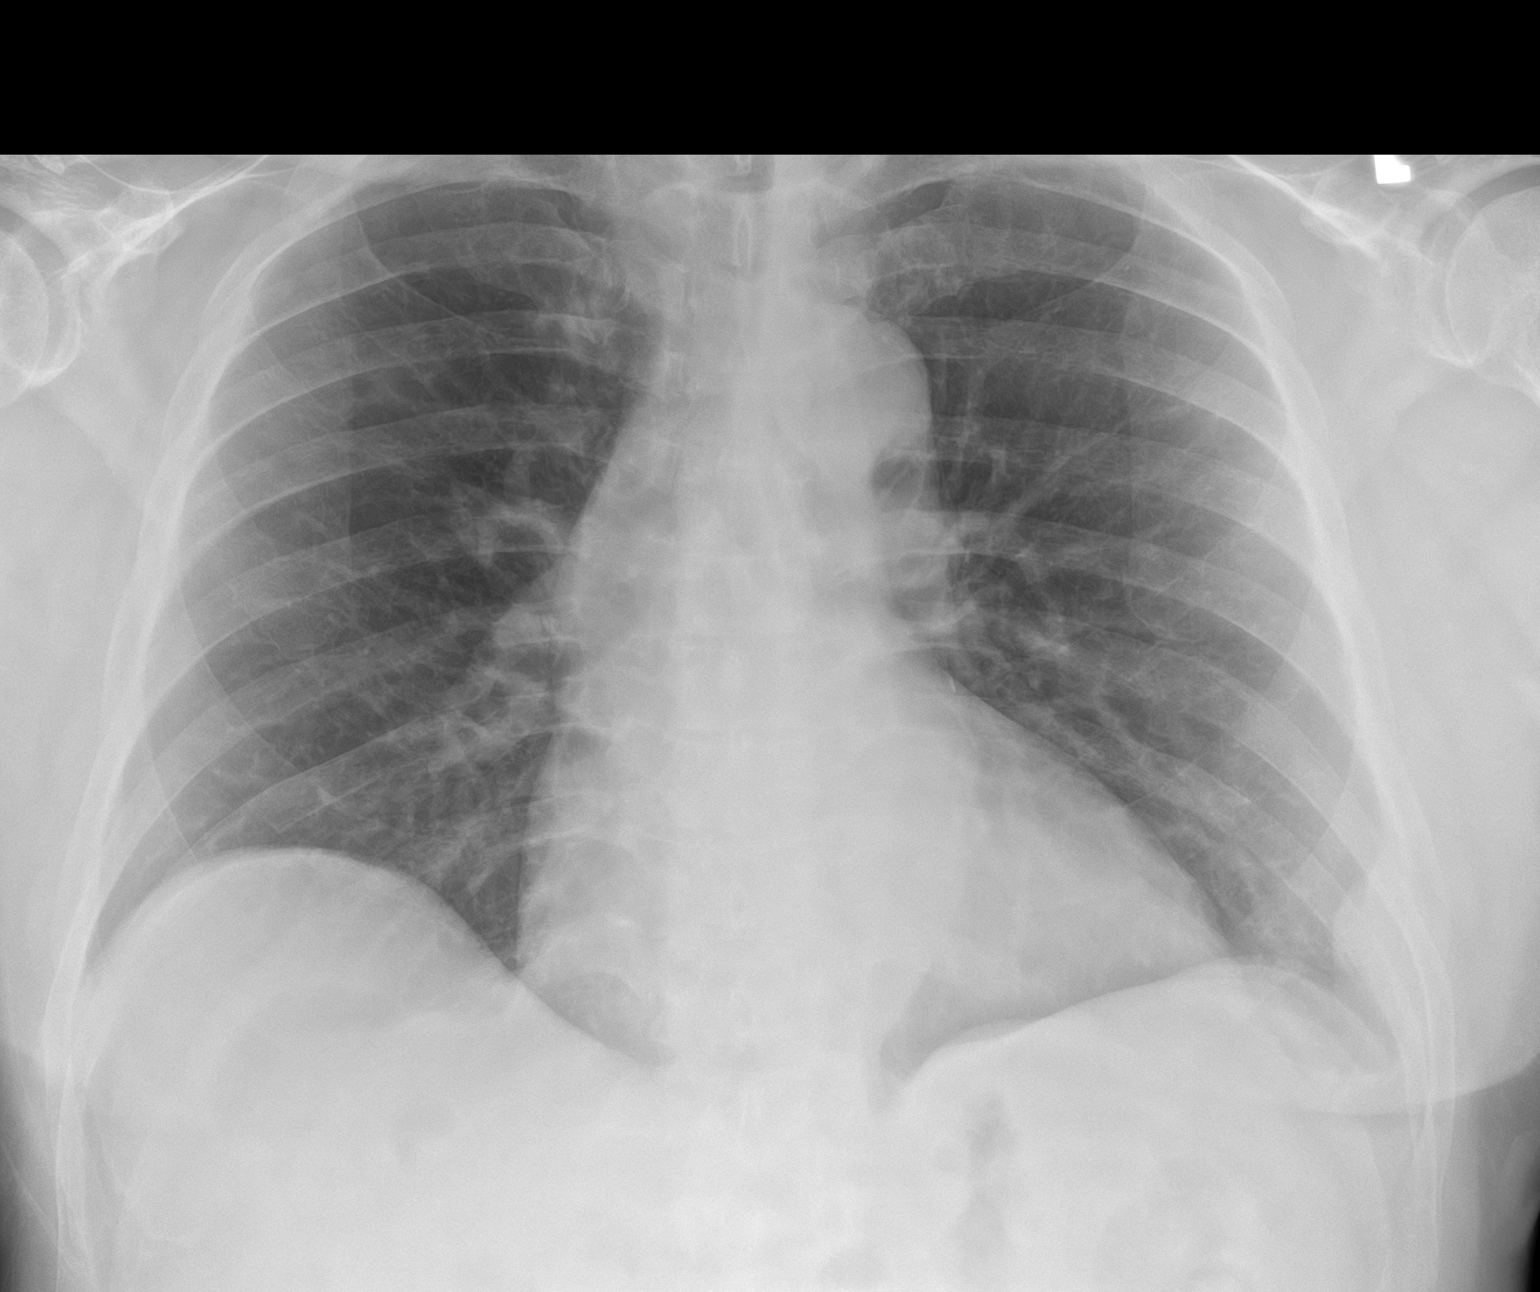

[1 of 1 positions shown; findings below may reference images not displayed]

FINDINGS: Right lung is clear. Convex opacity at the left CP angle. Normal
heart size. No pneumothorax.
IMPRESSION: Convex opacity at the left CP angle, may reflect pleural lesion or
small amount of loculated pleural fluid. CT chest could be obtained
if further imaging is desired.

## 2020-07-27 DIAGNOSIS — Z6832 Body mass index (BMI) 32.0-32.9, adult: Secondary | ICD-10-CM | POA: Diagnosis not present

## 2020-07-27 DIAGNOSIS — G894 Chronic pain syndrome: Secondary | ICD-10-CM | POA: Diagnosis not present

## 2020-07-27 DIAGNOSIS — M1991 Primary osteoarthritis, unspecified site: Secondary | ICD-10-CM | POA: Diagnosis not present

## 2020-07-27 DIAGNOSIS — M47816 Spondylosis without myelopathy or radiculopathy, lumbar region: Secondary | ICD-10-CM | POA: Diagnosis not present

## 2020-07-27 DIAGNOSIS — E6609 Other obesity due to excess calories: Secondary | ICD-10-CM | POA: Diagnosis not present

## 2020-07-27 DIAGNOSIS — I10 Essential (primary) hypertension: Secondary | ICD-10-CM | POA: Diagnosis not present

## 2021-03-24 DIAGNOSIS — Z79899 Other long term (current) drug therapy: Secondary | ICD-10-CM | POA: Diagnosis not present

## 2021-03-24 DIAGNOSIS — Z1389 Encounter for screening for other disorder: Secondary | ICD-10-CM | POA: Diagnosis not present

## 2021-03-24 DIAGNOSIS — I10 Essential (primary) hypertension: Secondary | ICD-10-CM | POA: Diagnosis not present

## 2021-03-24 DIAGNOSIS — D649 Anemia, unspecified: Secondary | ICD-10-CM | POA: Diagnosis not present

## 2021-03-24 DIAGNOSIS — M1990 Unspecified osteoarthritis, unspecified site: Secondary | ICD-10-CM | POA: Diagnosis not present

## 2022-01-21 DIAGNOSIS — M1612 Unilateral primary osteoarthritis, left hip: Secondary | ICD-10-CM | POA: Diagnosis not present

## 2022-02-04 DIAGNOSIS — Z4732 Aftercare following explantation of hip joint prosthesis: Secondary | ICD-10-CM | POA: Diagnosis not present

## 2022-02-04 DIAGNOSIS — Z471 Aftercare following joint replacement surgery: Secondary | ICD-10-CM | POA: Diagnosis not present

## 2022-02-16 DIAGNOSIS — Z96641 Presence of right artificial hip joint: Secondary | ICD-10-CM | POA: Diagnosis not present

## 2022-02-16 DIAGNOSIS — R5383 Other fatigue: Secondary | ICD-10-CM | POA: Diagnosis not present

## 2022-02-16 DIAGNOSIS — Z79899 Other long term (current) drug therapy: Secondary | ICD-10-CM | POA: Diagnosis not present

## 2022-02-16 DIAGNOSIS — I10 Essential (primary) hypertension: Secondary | ICD-10-CM | POA: Diagnosis not present

## 2022-02-16 DIAGNOSIS — Z0130 Encounter for examination of blood pressure without abnormal findings: Secondary | ICD-10-CM | POA: Diagnosis not present

## 2022-02-16 DIAGNOSIS — Z0000 Encounter for general adult medical examination without abnormal findings: Secondary | ICD-10-CM | POA: Diagnosis not present

## 2022-02-16 DIAGNOSIS — Z1159 Encounter for screening for other viral diseases: Secondary | ICD-10-CM | POA: Diagnosis not present

## 2022-02-16 DIAGNOSIS — Z6830 Body mass index (BMI) 30.0-30.9, adult: Secondary | ICD-10-CM | POA: Diagnosis not present

## 2022-02-16 DIAGNOSIS — E559 Vitamin D deficiency, unspecified: Secondary | ICD-10-CM | POA: Diagnosis not present

## 2022-02-16 DIAGNOSIS — Z131 Encounter for screening for diabetes mellitus: Secondary | ICD-10-CM | POA: Diagnosis not present

## 2022-03-04 DIAGNOSIS — Z4789 Encounter for other orthopedic aftercare: Secondary | ICD-10-CM | POA: Diagnosis not present

## 2022-03-09 DIAGNOSIS — Z6830 Body mass index (BMI) 30.0-30.9, adult: Secondary | ICD-10-CM | POA: Diagnosis not present

## 2022-03-09 DIAGNOSIS — M1990 Unspecified osteoarthritis, unspecified site: Secondary | ICD-10-CM | POA: Diagnosis not present

## 2022-03-09 DIAGNOSIS — E559 Vitamin D deficiency, unspecified: Secondary | ICD-10-CM | POA: Diagnosis not present

## 2022-03-09 DIAGNOSIS — I10 Essential (primary) hypertension: Secondary | ICD-10-CM | POA: Diagnosis not present

## 2022-03-09 DIAGNOSIS — D509 Iron deficiency anemia, unspecified: Secondary | ICD-10-CM | POA: Diagnosis not present

## 2022-03-23 DIAGNOSIS — M25551 Pain in right hip: Secondary | ICD-10-CM | POA: Diagnosis not present

## 2022-04-20 DIAGNOSIS — Z1211 Encounter for screening for malignant neoplasm of colon: Secondary | ICD-10-CM | POA: Diagnosis not present

## 2022-04-20 DIAGNOSIS — D500 Iron deficiency anemia secondary to blood loss (chronic): Secondary | ICD-10-CM | POA: Diagnosis not present

## 2022-04-20 DIAGNOSIS — Z800 Family history of malignant neoplasm of digestive organs: Secondary | ICD-10-CM | POA: Diagnosis not present

## 2022-05-19 DIAGNOSIS — D500 Iron deficiency anemia secondary to blood loss (chronic): Secondary | ICD-10-CM | POA: Diagnosis not present

## 2022-05-19 DIAGNOSIS — Z1211 Encounter for screening for malignant neoplasm of colon: Secondary | ICD-10-CM | POA: Diagnosis not present

## 2022-05-30 DIAGNOSIS — A048 Other specified bacterial intestinal infections: Secondary | ICD-10-CM | POA: Diagnosis not present

## 2022-06-29 DIAGNOSIS — M1991 Primary osteoarthritis, unspecified site: Secondary | ICD-10-CM | POA: Diagnosis not present

## 2022-06-29 DIAGNOSIS — E663 Overweight: Secondary | ICD-10-CM | POA: Diagnosis not present

## 2022-06-29 DIAGNOSIS — E782 Mixed hyperlipidemia: Secondary | ICD-10-CM | POA: Diagnosis not present

## 2022-06-29 DIAGNOSIS — M1611 Unilateral primary osteoarthritis, right hip: Secondary | ICD-10-CM | POA: Diagnosis not present

## 2022-06-29 DIAGNOSIS — G894 Chronic pain syndrome: Secondary | ICD-10-CM | POA: Diagnosis not present

## 2022-06-29 DIAGNOSIS — I10 Essential (primary) hypertension: Secondary | ICD-10-CM | POA: Diagnosis not present

## 2022-06-29 DIAGNOSIS — Z125 Encounter for screening for malignant neoplasm of prostate: Secondary | ICD-10-CM | POA: Diagnosis not present

## 2022-06-29 DIAGNOSIS — E7849 Other hyperlipidemia: Secondary | ICD-10-CM | POA: Diagnosis not present

## 2022-06-29 DIAGNOSIS — Z6829 Body mass index (BMI) 29.0-29.9, adult: Secondary | ICD-10-CM | POA: Diagnosis not present

## 2022-07-01 DIAGNOSIS — Z1211 Encounter for screening for malignant neoplasm of colon: Secondary | ICD-10-CM | POA: Diagnosis not present

## 2022-07-11 DIAGNOSIS — K635 Polyp of colon: Secondary | ICD-10-CM | POA: Diagnosis not present

## 2022-07-27 DIAGNOSIS — D509 Iron deficiency anemia, unspecified: Secondary | ICD-10-CM | POA: Diagnosis not present

## 2022-07-27 DIAGNOSIS — K3189 Other diseases of stomach and duodenum: Secondary | ICD-10-CM | POA: Diagnosis not present

## 2022-07-27 DIAGNOSIS — Z86010 Personal history of colonic polyps: Secondary | ICD-10-CM | POA: Diagnosis not present

## 2022-07-29 DIAGNOSIS — Z96642 Presence of left artificial hip joint: Secondary | ICD-10-CM | POA: Diagnosis not present

## 2022-11-09 DIAGNOSIS — D509 Iron deficiency anemia, unspecified: Secondary | ICD-10-CM | POA: Diagnosis not present

## 2022-11-09 DIAGNOSIS — R1030 Lower abdominal pain, unspecified: Secondary | ICD-10-CM | POA: Diagnosis not present

## 2022-11-29 DIAGNOSIS — Z131 Encounter for screening for diabetes mellitus: Secondary | ICD-10-CM | POA: Diagnosis not present

## 2022-11-29 DIAGNOSIS — Z0000 Encounter for general adult medical examination without abnormal findings: Secondary | ICD-10-CM | POA: Diagnosis not present

## 2022-11-29 DIAGNOSIS — Z6830 Body mass index (BMI) 30.0-30.9, adult: Secondary | ICD-10-CM | POA: Diagnosis not present

## 2022-11-29 DIAGNOSIS — Z87448 Personal history of other diseases of urinary system: Secondary | ICD-10-CM | POA: Diagnosis not present

## 2022-11-29 DIAGNOSIS — E0590 Thyrotoxicosis, unspecified without thyrotoxic crisis or storm: Secondary | ICD-10-CM | POA: Diagnosis not present

## 2022-11-29 DIAGNOSIS — R030 Elevated blood-pressure reading, without diagnosis of hypertension: Secondary | ICD-10-CM | POA: Diagnosis not present

## 2022-11-29 DIAGNOSIS — E559 Vitamin D deficiency, unspecified: Secondary | ICD-10-CM | POA: Diagnosis not present

## 2022-11-29 DIAGNOSIS — M19042 Primary osteoarthritis, left hand: Secondary | ICD-10-CM | POA: Diagnosis not present

## 2022-11-29 DIAGNOSIS — Z0130 Encounter for examination of blood pressure without abnormal findings: Secondary | ICD-10-CM | POA: Diagnosis not present

## 2022-11-29 DIAGNOSIS — R718 Other abnormality of red blood cells: Secondary | ICD-10-CM | POA: Diagnosis not present

## 2022-11-29 DIAGNOSIS — M19041 Primary osteoarthritis, right hand: Secondary | ICD-10-CM | POA: Diagnosis not present

## 2022-11-29 DIAGNOSIS — R5383 Other fatigue: Secondary | ICD-10-CM | POA: Diagnosis not present

## 2022-11-29 DIAGNOSIS — M79641 Pain in right hand: Secondary | ICD-10-CM | POA: Diagnosis not present

## 2022-11-29 DIAGNOSIS — M79642 Pain in left hand: Secondary | ICD-10-CM | POA: Diagnosis not present

## 2022-12-15 DIAGNOSIS — U071 COVID-19: Secondary | ICD-10-CM | POA: Diagnosis not present

## 2022-12-29 DIAGNOSIS — M19042 Primary osteoarthritis, left hand: Secondary | ICD-10-CM | POA: Diagnosis not present

## 2022-12-29 DIAGNOSIS — R030 Elevated blood-pressure reading, without diagnosis of hypertension: Secondary | ICD-10-CM | POA: Diagnosis not present

## 2022-12-29 DIAGNOSIS — I10 Essential (primary) hypertension: Secondary | ICD-10-CM | POA: Diagnosis not present

## 2022-12-29 DIAGNOSIS — Z789 Other specified health status: Secondary | ICD-10-CM | POA: Diagnosis not present

## 2022-12-29 DIAGNOSIS — Z131 Encounter for screening for diabetes mellitus: Secondary | ICD-10-CM | POA: Diagnosis not present

## 2022-12-29 DIAGNOSIS — Z96642 Presence of left artificial hip joint: Secondary | ICD-10-CM | POA: Diagnosis not present

## 2022-12-29 DIAGNOSIS — M19041 Primary osteoarthritis, right hand: Secondary | ICD-10-CM | POA: Diagnosis not present

## 2022-12-29 DIAGNOSIS — E0590 Thyrotoxicosis, unspecified without thyrotoxic crisis or storm: Secondary | ICD-10-CM | POA: Diagnosis not present

## 2022-12-29 DIAGNOSIS — Z6831 Body mass index (BMI) 31.0-31.9, adult: Secondary | ICD-10-CM | POA: Diagnosis not present

## 2023-02-15 DIAGNOSIS — D509 Iron deficiency anemia, unspecified: Secondary | ICD-10-CM | POA: Diagnosis not present

## 2023-02-15 DIAGNOSIS — K5909 Other constipation: Secondary | ICD-10-CM | POA: Diagnosis not present

## 2023-02-16 DIAGNOSIS — Z6830 Body mass index (BMI) 30.0-30.9, adult: Secondary | ICD-10-CM | POA: Diagnosis not present

## 2023-02-16 DIAGNOSIS — Z1331 Encounter for screening for depression: Secondary | ICD-10-CM | POA: Diagnosis not present

## 2023-02-16 DIAGNOSIS — E7849 Other hyperlipidemia: Secondary | ICD-10-CM | POA: Diagnosis not present

## 2023-02-16 DIAGNOSIS — I10 Essential (primary) hypertension: Secondary | ICD-10-CM | POA: Diagnosis not present

## 2023-02-16 DIAGNOSIS — E6609 Other obesity due to excess calories: Secondary | ICD-10-CM | POA: Diagnosis not present

## 2023-02-16 DIAGNOSIS — Z0001 Encounter for general adult medical examination with abnormal findings: Secondary | ICD-10-CM | POA: Diagnosis not present

## 2023-02-16 DIAGNOSIS — E782 Mixed hyperlipidemia: Secondary | ICD-10-CM | POA: Diagnosis not present

## 2023-05-25 DIAGNOSIS — E6609 Other obesity due to excess calories: Secondary | ICD-10-CM | POA: Diagnosis not present

## 2023-05-25 DIAGNOSIS — M7989 Other specified soft tissue disorders: Secondary | ICD-10-CM | POA: Diagnosis not present

## 2023-05-25 DIAGNOSIS — Z6831 Body mass index (BMI) 31.0-31.9, adult: Secondary | ICD-10-CM | POA: Diagnosis not present

## 2023-05-25 DIAGNOSIS — T461X5A Adverse effect of calcium-channel blockers, initial encounter: Secondary | ICD-10-CM | POA: Diagnosis not present

## 2023-07-11 DIAGNOSIS — K3189 Other diseases of stomach and duodenum: Secondary | ICD-10-CM | POA: Diagnosis not present

## 2023-07-27 DIAGNOSIS — Z1211 Encounter for screening for malignant neoplasm of colon: Secondary | ICD-10-CM | POA: Diagnosis not present

## 2023-07-27 DIAGNOSIS — K3189 Other diseases of stomach and duodenum: Secondary | ICD-10-CM | POA: Diagnosis not present

## 2023-07-31 DIAGNOSIS — H25811 Combined forms of age-related cataract, right eye: Secondary | ICD-10-CM | POA: Diagnosis not present

## 2023-07-31 DIAGNOSIS — H2512 Age-related nuclear cataract, left eye: Secondary | ICD-10-CM | POA: Diagnosis not present

## 2023-08-07 DIAGNOSIS — Z23 Encounter for immunization: Secondary | ICD-10-CM | POA: Diagnosis not present

## 2023-08-16 DIAGNOSIS — K319 Disease of stomach and duodenum, unspecified: Secondary | ICD-10-CM | POA: Diagnosis not present

## 2023-08-21 DIAGNOSIS — H25811 Combined forms of age-related cataract, right eye: Secondary | ICD-10-CM | POA: Diagnosis not present

## 2023-08-22 NOTE — H&P (Signed)
Surgical History & Physical  Patient Name: Jason Cole  DOB: May 01, 1956  Surgery: Cataract extraction with intraocular lens implant phacoemulsification; Right Eye Surgeon: Fabio Pierce MD Surgery Date: 08/28/2023 Pre-Op Date: 07/31/2023  HPI: A 60 Yr. old male patient 1. The patient is here for a cataract evaluation of both eyes The patient's vision is blurry, mostly with distance vision. The condition's severity is constant. Patient states night lights are very bright. Pt. has experienced these symptoms for the past several years. This is negatively affecting the patient's quality of life and the patient is unable to function adequately in life with the current level of vision. HPI was performed by Fabio Pierce .  Medical History:  Arthritis High Blood Pressure high cholesterol  Review of Systems Negative Allergic/Immunologic Hypertension Cardiovascular Negative Constitutional Negative Ear, Nose, Mouth & Throat Negative Endocrine Negative Eyes Negative Gastrointestinal Negative Genitourinary Negative Hemotologic/Lymphatic Negative Integumentary Arthritis Musculoskeletal Negative Neurological Negative Psychiatry Negative Respiratory  Social Never smoked   Medication gabapentin ,  amlodipine ,  furosemide ,  rosuvastatin   Sx/Procedures None  Drug Allergies  NKDA  History & Physical: Heent: cataracts  NECK: supple without bruits LUNGS: lungs clear to auscultation CV: regular rate and rhythm Abdomen: soft and non-tender  Impression & Plan: Assessment: 1.  COMBINED FORMS AGE RELATED CATARACT; Right Eye (H25.811) 2.  NUCLEAR SCLEROSIS AGE RELATED; Left Eye (H25.12)  Plan: 1.  Cataract accounts for the patient's decreased vision. This visual impairment is not correctable with a tolerable change in glasses or contact lenses. Cataract surgery with an implantation of a new lens should significantly improve the visual and functional status of the patient.  Discussed all risks, benefits, alternatives, and potential complications. Discussed the procedures and recovery. Patient desires to have surgery. A-scan ordered and performed today for intra-ocular lens calculations. The surgery will be performed in order to improve vision for driving, reading, and for eye examinations. Recommend phacoemulsification with intra-ocular lens. Recommend Dextenza for post-operative pain and inflammation. Right Eye worse - first. Dilates well - shugarcaine by protocol. Consider Vivity IOL - veteran, waive surgeon's fee.  2.  Will address after right eye.

## 2023-08-24 ENCOUNTER — Encounter (HOSPITAL_COMMUNITY): Payer: Self-pay

## 2023-08-24 ENCOUNTER — Encounter (HOSPITAL_COMMUNITY)
Admission: RE | Admit: 2023-08-24 | Discharge: 2023-08-24 | Disposition: A | Discharge status: Home / Self Care | Payer: Medicare HMO | Source: Ambulatory Visit | Attending: Ophthalmology | Admitting: Ophthalmology

## 2023-08-24 ENCOUNTER — Other Ambulatory Visit: Payer: Self-pay

## 2023-08-24 DIAGNOSIS — K2970 Gastritis, unspecified, without bleeding: Secondary | ICD-10-CM | POA: Diagnosis not present

## 2023-08-24 NOTE — Pre-Procedure Instructions (Signed)
Attempted pre-op phone call. Left VM for him to call us back. 

## 2023-08-28 ENCOUNTER — Encounter (HOSPITAL_COMMUNITY)
Admission: RE | Disposition: A | Discharge status: Home / Self Care | Payer: Self-pay | Source: Home / Self Care | Attending: Ophthalmology

## 2023-08-28 ENCOUNTER — Ambulatory Visit (HOSPITAL_COMMUNITY)
Admission: RE | Admit: 2023-08-28 | Discharge: 2023-08-28 | Disposition: A | Discharge status: Home / Self Care | Payer: Medicare HMO | Attending: Ophthalmology | Admitting: Ophthalmology

## 2023-08-28 ENCOUNTER — Ambulatory Visit (HOSPITAL_COMMUNITY): Payer: Medicare HMO | Admitting: Anesthesiology

## 2023-08-28 ENCOUNTER — Encounter (HOSPITAL_COMMUNITY): Payer: Self-pay | Admitting: Ophthalmology

## 2023-08-28 DIAGNOSIS — H5711 Ocular pain, right eye: Secondary | ICD-10-CM | POA: Insufficient documentation

## 2023-08-28 DIAGNOSIS — I10 Essential (primary) hypertension: Secondary | ICD-10-CM | POA: Insufficient documentation

## 2023-08-28 DIAGNOSIS — H2512 Age-related nuclear cataract, left eye: Secondary | ICD-10-CM | POA: Insufficient documentation

## 2023-08-28 DIAGNOSIS — H25811 Combined forms of age-related cataract, right eye: Secondary | ICD-10-CM | POA: Insufficient documentation

## 2023-08-28 SURGERY — CATARACT EXTRACTION PHACO AND INTRAOCULAR LENS PLACEMENT (IOC) with placement of Corticosteroid
Anesthesia: Monitor Anesthesia Care | Site: Eye | Laterality: Right

## 2023-08-28 MED ORDER — SODIUM HYALURONATE 10 MG/ML IO SOLUTION
PREFILLED_SYRINGE | INTRAOCULAR | Status: DC | PRN
  Administered 2023-08-28: .85 mL via INTRAOCULAR

## 2023-08-28 MED ORDER — DEXAMETHASONE 0.4 MG OP INST
VAGINAL_INSERT | OPHTHALMIC | Status: AC
Start: 2023-08-28 — End: ?
  Filled 2023-08-28: qty 1

## 2023-08-28 MED ORDER — BSS IO SOLN
INTRAOCULAR | Status: DC | PRN
  Administered 2023-08-28: 15 mL via INTRAOCULAR

## 2023-08-28 MED ORDER — DEXAMETHASONE 0.4 MG OP INST
VAGINAL_INSERT | OPHTHALMIC | Status: DC | PRN
  Administered 2023-08-28: .4 mg via OPHTHALMIC

## 2023-08-28 MED ORDER — MOXIFLOXACIN HCL 5 MG/ML IO SOLN
INTRAOCULAR | Status: DC | PRN
  Administered 2023-08-28: .3 mL via INTRACAMERAL

## 2023-08-28 MED ORDER — TROPICAMIDE 1 % OP SOLN
1.0000 [drp] | OPHTHALMIC | Status: AC | PRN
  Administered 2023-08-28 (×3): 1 [drp] via OPHTHALMIC

## 2023-08-28 MED ORDER — LIDOCAINE HCL 3.5 % OP GEL
1.0000 | Freq: Once | OPHTHALMIC | Status: AC
  Administered 2023-08-28: 1 via OPHTHALMIC

## 2023-08-28 MED ORDER — STERILE WATER FOR IRRIGATION IR SOLN
Status: DC | PRN
  Administered 2023-08-28: 1

## 2023-08-28 MED ORDER — EPINEPHRINE PF 1 MG/ML IJ SOLN
INTRAOCULAR | Status: DC | PRN
  Administered 2023-08-28: 500 mL

## 2023-08-28 MED ORDER — TETRACAINE HCL 0.5 % OP SOLN
1.0000 [drp] | OPHTHALMIC | Status: AC | PRN
  Administered 2023-08-28 (×3): 1 [drp] via OPHTHALMIC

## 2023-08-28 MED ORDER — POVIDONE-IODINE 5 % OP SOLN
OPHTHALMIC | Status: DC | PRN
  Administered 2023-08-28: 1 via OPHTHALMIC

## 2023-08-28 MED ORDER — PHENYLEPHRINE HCL 2.5 % OP SOLN
1.0000 [drp] | OPHTHALMIC | Status: AC | PRN
  Administered 2023-08-28 (×3): 1 [drp] via OPHTHALMIC

## 2023-08-28 MED ORDER — SODIUM HYALURONATE 23MG/ML IO SOSY
PREFILLED_SYRINGE | INTRAOCULAR | Status: DC | PRN
  Administered 2023-08-28: .6 mL via INTRAOCULAR

## 2023-08-28 MED ORDER — LIDOCAINE HCL (PF) 1 % IJ SOLN
INTRAOCULAR | Status: DC | PRN
  Administered 2023-08-28: 1 mL via OPHTHALMIC

## 2023-08-28 MED ORDER — MIDAZOLAM HCL 2 MG/2ML IJ SOLN
INTRAMUSCULAR | Status: AC
  Filled 2023-08-28: qty 2

## 2023-08-28 MED ORDER — MIDAZOLAM HCL 2 MG/2ML IJ SOLN
INTRAMUSCULAR | Status: DC | PRN
  Administered 2023-08-28: 2 mg via INTRAVENOUS

## 2023-08-28 SURGICAL SUPPLY — 15 items
CATARACT SUITE SIGHTPATH (MISCELLANEOUS) ×1
CLOTH BEACON ORANGE TIMEOUT ST (SAFETY) ×1 IMPLANT
EYE SHIELD UNIVERSAL CLEAR (GAUZE/BANDAGES/DRESSINGS) IMPLANT
FEE CATARACT SUITE SIGHTPATH (MISCELLANEOUS) ×1 IMPLANT
GLOVE BIOGEL PI IND STRL 7.0 (GLOVE) ×2 IMPLANT
LENS IOL TECNIS EYHANCE 13.0 (Intraocular Lens) IMPLANT
NDL HYPO 18GX1.5 BLUNT FILL (NEEDLE) ×1 IMPLANT
NEEDLE HYPO 18GX1.5 BLUNT FILL (NEEDLE) ×1
PAD ARMBOARD 7.5X6 YLW CONV (MISCELLANEOUS) ×1 IMPLANT
POSITIONER HEAD 8X9X4 ADT (SOFTGOODS) ×1 IMPLANT
RING MALYGIN (MISCELLANEOUS) IMPLANT
RING MALYGIN 7.0 (MISCELLANEOUS) IMPLANT
SYR TB 1ML LL NO SAFETY (SYRINGE) ×1 IMPLANT
TAPE SURG TRANSPORE 1 IN (GAUZE/BANDAGES/DRESSINGS) IMPLANT
WATER STERILE IRR 250ML POUR (IV SOLUTION) ×1 IMPLANT

## 2023-08-28 NOTE — Discharge Instructions (Signed)
Please discharge patient when stable, will follow up today with Dr. Carnella Fryman at the [facility] office immediately following discharge.  Leave shield in place until visit.  All paperwork with discharge instructions will be given at the office.  [facility] Address:  [address]  [place], [place] [address]  

## 2023-08-28 NOTE — Transfer of Care (Signed)
Immediate Anesthesia Transfer of Care Note  Patient: Jason Cole  Procedure(s) Performed: CATARACT EXTRACTION PHACO AND INTRAOCULAR LENS PLACEMENT (IOC) with placement of Corticosteroid (Right: Eye)  Patient Location: Short Stay  Anesthesia Type:MAC  Level of Consciousness: awake, alert , and oriented  Airway & Oxygen Therapy: Patient Spontanous Breathing  Post-op Assessment: Report given to RN and Post -op Vital signs reviewed and stable  Post vital signs: Reviewed and stable  Last Vitals:  Vitals Value Taken Time  BP 133/86 08/28/23 1016  Temp 36.8 C 08/28/23 1016  Pulse 69 08/28/23 1016  Resp 16 08/28/23 1016  SpO2 100 % 08/28/23 1016    Last Pain:  Vitals:   08/28/23 1016  TempSrc: Oral  PainSc: 0-No pain         Complications: No notable events documented.

## 2023-08-28 NOTE — Interval H&P Note (Signed)
History and Physical Interval Note:  08/28/2023 9:49 AM  Jason Cole  has presented today for surgery, with the diagnosis of combined forms age related cataract, right eye.  The various methods of treatment have been discussed with the patient and family. After consideration of risks, benefits and other options for treatment, the patient has consented to  Procedure(s): CATARACT EXTRACTION PHACO AND INTRAOCULAR LENS PLACEMENT (IOC) with placement of Corticosteroid (Right) as a surgical intervention.  The patient's history has been reviewed, patient examined, no change in status, stable for surgery.  I have reviewed the patient's chart and labs.  Questions were answered to the patient's satisfaction.     Fabio Pierce

## 2023-08-28 NOTE — Anesthesia Preprocedure Evaluation (Addendum)
Anesthesia Evaluation  Patient identified by MRN, date of birth, ID band Patient awake    Reviewed: Allergy & Precautions, H&P , NPO status , Patient's Chart, lab work & pertinent test results, reviewed documented beta blocker date and time   Airway Mallampati: II  TM Distance: >3 FB Neck ROM: full    Dental no notable dental hx.    Pulmonary neg pulmonary ROS   Pulmonary exam normal breath sounds clear to auscultation       Cardiovascular Exercise Tolerance: Good hypertension,  Rhythm:regular Rate:Normal     Neuro/Psych negative neurological ROS  negative psych ROS   GI/Hepatic negative GI ROS, Neg liver ROS,,,  Endo/Other  negative endocrine ROS    Renal/GU Renal diseasenegative Renal ROS  negative genitourinary   Musculoskeletal   Abdominal   Peds  Hematology negative hematology ROS (+)   Anesthesia Other Findings   Reproductive/Obstetrics negative OB ROS                             Anesthesia Physical Anesthesia Plan  ASA: 2  Anesthesia Plan: MAC   Post-op Pain Management:    Induction:   PONV Risk Score and Plan:   Airway Management Planned:   Additional Equipment:   Intra-op Plan:   Post-operative Plan:   Informed Consent: I have reviewed the patients History and Physical, chart, labs and discussed the procedure including the risks, benefits and alternatives for the proposed anesthesia with the patient or authorized representative who has indicated his/her understanding and acceptance.     Dental Advisory Given  Plan Discussed with: CRNA  Anesthesia Plan Comments:        Anesthesia Quick Evaluation

## 2023-08-28 NOTE — Op Note (Signed)
Date of procedure: 08/28/23  Pre-operative diagnosis:  Visually significant combined form age-related cataract, Right Eye (H25.811)  Post-operative diagnosis:   1. Visually significant combined form age-related cataract, Right Eye (H25.811) 2. Pain and inflammation following cataract surgery Right Eye (H57.11)  Procedure:  Removal of cataract via phacoemulsification and insertion of intra-ocular lens Johnson [employer] DIB00 +13.0D into the capsular bag of the Right Eye 2. Placement of Dextenza insert, Right Eye  Attending surgeon: Rudy Jew. Yvanna Vidas, MD, MA  Anesthesia: MAC, Topical Akten  Complications: None  Estimated Blood Loss: <41mL (minimal)  Specimens: None  Implants: As above  Indications:  Visually significant age-related cataract, Right Eye  Procedure:  The patient was seen and identified in the pre-operative area. The operative eye was identified and dilated.  The operative eye was marked.  Topical anesthesia was administered to the operative eye.     The patient was then to the operative suite and placed in the supine position.  A timeout was performed confirming the patient, procedure to be performed, and all other relevant information.   The patient's face was prepped and draped in the usual fashion for intra-ocular surgery.  A lid speculum was placed into the operative eye and the surgical microscope moved into place and focused.  A superotemporal paracentesis was created using a 20 gauge paracentesis blade.  Shugarcaine was injected into the anterior chamber.  Viscoelastic was injected into the anterior chamber.  A temporal clear-corneal main wound incision was created using a 2.1mm microkeratome.  A continuous curvilinear capsulorrhexis was initiated using an irrigating cystitome and completed using capsulorrhexis forceps.  Hydrodissection and hydrodeliniation were performed.  Viscoelastic was injected into the anterior chamber.  A phacoemulsification handpiece and a  chopper as a second instrument were used to remove the nucleus and epinucleus. The irrigation/aspiration handpiece was used to remove any remaining cortical material.   The capsular bag was reinflated with viscoelastic, checked, and found to be intact.  The intraocular lens was inserted into the capsular bag.  The irrigation/aspiration handpiece was used to remove any remaining viscoelastic.  The clear corneal wound and paracentesis wounds were then hydrated and checked with Weck-Cels to be watertight. 0.63mL of moxifloxacin was injected into the anterior chamber.  The lid-speculum was removed. The lower punctum was dilated. A Dextenza implant was placed in the lower canaliculus without complication.  The drape was removed.  The patient's face was cleaned with a wet and dry 4x4. A clear shield was taped over the eye. The patient was taken to the post-operative care unit in good condition, having tolerated the procedure well.  Post-Op Instructions: The patient will follow up at [facility] for a same day post-operative evaluation and will receive all other orders and instructions.

## 2023-08-28 NOTE — Anesthesia Postprocedure Evaluation (Signed)
Anesthesia Post Note  Patient: Jason Cole  Procedure(s) Performed: CATARACT EXTRACTION PHACO AND INTRAOCULAR LENS PLACEMENT (IOC) with placement of Corticosteroid (Right: Eye)  Patient location during evaluation: Phase II Anesthesia Type: MAC Level of consciousness: awake Pain management: pain level controlled Vital Signs Assessment: post-procedure vital signs reviewed and stable Respiratory status: spontaneous breathing and respiratory function stable Cardiovascular status: blood pressure returned to baseline and stable Postop Assessment: no headache and no apparent nausea or vomiting Anesthetic complications: no Comments: Late entry   No notable events documented.   Last Vitals:  Vitals:   08/28/23 0909 08/28/23 1016  BP: (!) 170/85 133/86  Pulse: (!) 56 69  Resp: 15 16  Temp: 36.6 C 36.8 C  SpO2: 100% 100%    Last Pain:  Vitals:   08/28/23 1016  TempSrc: Oral  PainSc: 0-No pain                 Windell Norfolk

## 2023-09-04 DIAGNOSIS — H2512 Age-related nuclear cataract, left eye: Secondary | ICD-10-CM | POA: Diagnosis not present

## 2023-09-07 ENCOUNTER — Encounter (HOSPITAL_COMMUNITY): Payer: Self-pay

## 2023-09-07 ENCOUNTER — Encounter (HOSPITAL_COMMUNITY)
Admission: RE | Admit: 2023-09-07 | Discharge: 2023-09-07 | Disposition: A | Discharge status: Home / Self Care | Payer: Medicare HMO | Source: Ambulatory Visit | Attending: Ophthalmology | Admitting: Ophthalmology

## 2023-09-08 NOTE — H&P (Signed)
Surgical History & Physical  Patient Name: Jason Cole  DOB: 06-Apr-1956  Surgery: Cataract extraction with intraocular lens implant phacoemulsification; Left Eye Surgeon: Fabio Pierce MD Surgery Date: 09/11/2023 Pre-Op Date: 09/04/2023  HPI: A 58 Yr. old male patient present for 1 week post op OD. Patient is doing well, vision has improved. Using Combo TID OD. He does have an achy pain OD once in awhile. Patient also here for continued blurry vision in the left eye. Difficulties watching TV, driving at night due to glare and halos and on bright sunny days. This is negatively affecting the patient's quality of life and the patient is unable to function adequately in life with the current level of vision. Patient would like to proceed with cataract sx OS. HPI Completed by Dr. Fabio Pierce  Medical History:  Arthritis High Blood Pressure high cholesterol  Review of Systems Negative Allergic/Immunologic Hypertensive Cardiovascular Negative Constitutional Negative Ear, Nose, Mouth & Throat Negative Endocrine Negative Eyes Negative Gastrointestinal Negative Genitourinary Negative Hemotologic/Lymphatic Negative Integumentary Arthritis Musculoskeletal Negative Neurological Negative Psychiatry Negative Respiratory  Social Never smoked   Medication Prednisolone-moxiflox-bromfen,  gabapentin ,  amlodipine ,  furosemide ,  rosuvastatin ,  dorzolamide-timolol   Sx/Procedures Phaco c IOL OD  Drug Allergies  NKDA  History & Physical: Heent: cataract  NECK: supple without bruits LUNGS: lungs clear to auscultation CV: regular rate and rhythm Abdomen: soft and non-tender  Impression & Plan: Assessment: 1.  NUCLEAR SCLEROSIS AGE RELATED; Left Eye (H25.12) 2.  CATARACT EXTRACTION STATUS; Right Eye (Z98.41)  Plan: 1.  Cataract accounts for the patient's decreased vision. This visual impairment is not correctable with a tolerable change in glasses or contact lenses.  Cataract surgery with an implantation of a new lens should significantly improve the visual and functional status of the patient. Discussed all risks, benefits, alternatives, and potential complications. Discussed the procedures and recovery. Patient desires to have surgery. A-scan ordered and performed today for intra-ocular lens calculations. The surgery will be performed in order to improve vision for driving, reading, and for eye examinations. Recommend phacoemulsification with intra-ocular lens. Recommend Dextenza for post-operative pain and inflammation. Left Eye. Surgery required to correct imbalance of vision. Dilates well - shugarcaine by protocol.  2.  1 week after cataract surgery. Doing well with improved vision. IOP elevated today - most likely steroid response. Call with any problems or concerns. Stop Combo Drop - Dextenza. Start generic Cosopt 1 drop both eyes every 12 hours.

## 2023-09-11 ENCOUNTER — Encounter (HOSPITAL_COMMUNITY)
Admission: RE | Disposition: A | Discharge status: Home / Self Care | Payer: Self-pay | Source: Home / Self Care | Attending: Ophthalmology

## 2023-09-11 ENCOUNTER — Encounter (HOSPITAL_COMMUNITY): Payer: Self-pay | Admitting: Ophthalmology

## 2023-09-11 ENCOUNTER — Ambulatory Visit (HOSPITAL_COMMUNITY)
Admission: RE | Admit: 2023-09-11 | Discharge: 2023-09-11 | Disposition: A | Discharge status: Home / Self Care | Payer: Medicare HMO | Attending: Ophthalmology | Admitting: Ophthalmology

## 2023-09-11 ENCOUNTER — Ambulatory Visit (HOSPITAL_COMMUNITY): Payer: Medicare HMO | Admitting: Anesthesiology

## 2023-09-11 ENCOUNTER — Other Ambulatory Visit: Payer: Self-pay

## 2023-09-11 DIAGNOSIS — H2512 Age-related nuclear cataract, left eye: Secondary | ICD-10-CM

## 2023-09-11 DIAGNOSIS — I129 Hypertensive chronic kidney disease with stage 1 through stage 4 chronic kidney disease, or unspecified chronic kidney disease: Secondary | ICD-10-CM | POA: Diagnosis not present

## 2023-09-11 DIAGNOSIS — N189 Chronic kidney disease, unspecified: Secondary | ICD-10-CM | POA: Diagnosis not present

## 2023-09-11 DIAGNOSIS — I10 Essential (primary) hypertension: Secondary | ICD-10-CM | POA: Diagnosis not present

## 2023-09-11 SURGERY — CATARACT EXTRACTION PHACO AND INTRAOCULAR LENS PLACEMENT (IOC) with placement of Corticosteroid
Anesthesia: Monitor Anesthesia Care | Site: Eye | Laterality: Left

## 2023-09-11 MED ORDER — FENTANYL CITRATE (PF) 100 MCG/2ML IJ SOLN
INTRAMUSCULAR | Status: AC
  Filled 2023-09-11: qty 2

## 2023-09-11 MED ORDER — FENTANYL CITRATE (PF) 100 MCG/2ML IJ SOLN
INTRAMUSCULAR | Status: DC | PRN
  Administered 2023-09-11: 50 ug via INTRAVENOUS

## 2023-09-11 MED ORDER — STERILE WATER FOR IRRIGATION IR SOLN
Status: DC | PRN
  Administered 2023-09-11: 1

## 2023-09-11 MED ORDER — SODIUM HYALURONATE 23MG/ML IO SOSY
PREFILLED_SYRINGE | INTRAOCULAR | Status: DC | PRN
  Administered 2023-09-11: .6 mL via INTRAOCULAR

## 2023-09-11 MED ORDER — BSS IO SOLN
INTRAOCULAR | Status: DC | PRN
  Administered 2023-09-11: 15 mL via INTRAOCULAR

## 2023-09-11 MED ORDER — TROPICAMIDE 1 % OP SOLN
1.0000 [drp] | OPHTHALMIC | Status: AC | PRN
  Administered 2023-09-11 (×3): 1 [drp] via OPHTHALMIC

## 2023-09-11 MED ORDER — MIDAZOLAM HCL 2 MG/2ML IJ SOLN
INTRAMUSCULAR | Status: DC | PRN
  Administered 2023-09-11: 2 mg via INTRAVENOUS

## 2023-09-11 MED ORDER — PHENYLEPHRINE HCL 2.5 % OP SOLN
1.0000 [drp] | OPHTHALMIC | Status: AC | PRN
  Administered 2023-09-11 (×3): 1 [drp] via OPHTHALMIC

## 2023-09-11 MED ORDER — SODIUM CHLORIDE 0.9% FLUSH: 10.0000 mL | Freq: Two times a day (BID) | INTRAVENOUS | Status: DC

## 2023-09-11 MED ORDER — CHLORHEXIDINE GLUCONATE 0.12 % MT SOLN: 15.0000 mL | Freq: Once | OROMUCOSAL | Status: DC

## 2023-09-11 MED ORDER — SODIUM HYALURONATE 10 MG/ML IO SOLUTION
PREFILLED_SYRINGE | INTRAOCULAR | Status: DC | PRN
  Administered 2023-09-11: .85 mL via INTRAOCULAR

## 2023-09-11 MED ORDER — ORAL CARE MOUTH RINSE: 15.0000 mL | Freq: Once | OROMUCOSAL | Status: DC

## 2023-09-11 MED ORDER — FENTANYL CITRATE (PF) 100 MCG/2ML IJ SOLN: INTRAMUSCULAR | Status: DC | PRN

## 2023-09-11 MED ORDER — POVIDONE-IODINE 5 % OP SOLN
OPHTHALMIC | Status: DC | PRN
  Administered 2023-09-11: 1 via OPHTHALMIC

## 2023-09-11 MED ORDER — EPINEPHRINE PF 1 MG/ML IJ SOLN
INTRAOCULAR | Status: DC | PRN
  Administered 2023-09-11: 500 mL

## 2023-09-11 MED ORDER — MOXIFLOXACIN HCL 5 MG/ML IO SOLN
INTRAOCULAR | Status: DC | PRN
  Administered 2023-09-11: .3 mL via INTRACAMERAL

## 2023-09-11 MED ORDER — LIDOCAINE HCL 3.5 % OP GEL
1.0000 | Freq: Once | OPHTHALMIC | Status: AC
  Administered 2023-09-11: 1 via OPHTHALMIC

## 2023-09-11 MED ORDER — SODIUM CHLORIDE 0.9% FLUSH
INTRAVENOUS | Status: DC | PRN
  Administered 2023-09-11: 5 mL via INTRAVENOUS

## 2023-09-11 MED ORDER — MIDAZOLAM HCL 2 MG/2ML IJ SOLN
INTRAMUSCULAR | Status: AC
Start: 2023-09-11 — End: ?
  Filled 2023-09-11: qty 2

## 2023-09-11 MED ORDER — TETRACAINE HCL 0.5 % OP SOLN
1.0000 [drp] | OPHTHALMIC | Status: AC | PRN
Start: 2023-09-11 — End: 2023-09-11
  Administered 2023-09-11 (×3): 1 [drp] via OPHTHALMIC

## 2023-09-11 MED ORDER — LIDOCAINE HCL (PF) 1 % IJ SOLN
INTRAOCULAR | Status: DC | PRN
  Administered 2023-09-11: 1 mL via OPHTHALMIC

## 2023-09-11 SURGICAL SUPPLY — 13 items
CATARACT SUITE SIGHTPATH (MISCELLANEOUS) ×1
CLOTH BEACON ORANGE TIMEOUT ST (SAFETY) ×1 IMPLANT
EYE SHIELD UNIVERSAL CLEAR (GAUZE/BANDAGES/DRESSINGS) IMPLANT
FEE CATARACT SUITE SIGHTPATH (MISCELLANEOUS) ×1 IMPLANT
GLOVE BIOGEL PI IND STRL 7.0 (GLOVE) ×2 IMPLANT
LENS IOL TECNIS EYHANCE 15.5 (Intraocular Lens) IMPLANT
NDL HYPO 18GX1.5 BLUNT FILL (NEEDLE) ×1 IMPLANT
NEEDLE HYPO 18GX1.5 BLUNT FILL (NEEDLE) ×1
PAD ARMBOARD 7.5X6 YLW CONV (MISCELLANEOUS) ×1 IMPLANT
POSITIONER HEAD 8X9X4 ADT (SOFTGOODS) ×1 IMPLANT
SYR TB 1ML LL NO SAFETY (SYRINGE) ×1 IMPLANT
TAPE SURG TRANSPORE 1 IN (GAUZE/BANDAGES/DRESSINGS) IMPLANT
WATER STERILE IRR 250ML POUR (IV SOLUTION) ×1 IMPLANT

## 2023-09-11 NOTE — Anesthesia Preprocedure Evaluation (Signed)
Anesthesia Evaluation  Patient identified by MRN, date of birth, ID band Patient awake    Reviewed: Allergy & Precautions, H&P , NPO status , Patient's Chart, lab work & pertinent test results, reviewed documented beta blocker date and time   Airway Mallampati: II  TM Distance: >3 FB Neck ROM: full    Dental no notable dental hx.    Pulmonary neg pulmonary ROS   Pulmonary exam normal breath sounds clear to auscultation       Cardiovascular Exercise Tolerance: Good hypertension, Normal cardiovascular exam Rhythm:regular Rate:Normal     Neuro/Psych negative neurological ROS  negative psych ROS   GI/Hepatic negative GI ROS, Neg liver ROS,,,  Endo/Other  negative endocrine ROS    Renal/GU Renal disease  negative genitourinary   Musculoskeletal   Abdominal   Peds  Hematology negative hematology ROS (+)   Anesthesia Other Findings   Reproductive/Obstetrics negative OB ROS                             Anesthesia Physical Anesthesia Plan  ASA: 3  Anesthesia Plan: MAC   Post-op Pain Management: Minimal or no pain anticipated   Induction:   PONV Risk Score and Plan:   Airway Management Planned: Nasal Cannula and Natural Airway  Additional Equipment: None  Intra-op Plan:   Post-operative Plan:   Informed Consent: I have reviewed the patients History and Physical, chart, labs and discussed the procedure including the risks, benefits and alternatives for the proposed anesthesia with the patient or authorized representative who has indicated his/her understanding and acceptance.     Dental Advisory Given and Dental advisory given  Plan Discussed with: CRNA  Anesthesia Plan Comments:        Anesthesia Quick Evaluation

## 2023-09-11 NOTE — Interval H&P Note (Signed)
History and Physical Interval Note:  09/11/2023 11:00 AM  Jason Cole  has presented today for surgery, with the diagnosis of age related nuclear cataract, left eye.  The various methods of treatment have been discussed with the patient and family. After consideration of risks, benefits and other options for treatment, the patient has consented to  Procedure(s): CATARACT EXTRACTION PHACO AND INTRAOCULAR LENS PLACEMENT (IOC) (Left) as a surgical intervention.  The patient's history has been reviewed, patient examined, no change in status, stable for surgery.  I have reviewed the patient's chart and labs.  Questions were answered to the patient's satisfaction.     Fabio Pierce

## 2023-09-11 NOTE — Discharge Instructions (Addendum)
Please discharge patient when stable, will follow up today with Dr. Carnella Fryman at the [facility] office immediately following discharge.  Leave shield in place until visit.  All paperwork with discharge instructions will be given at the office.  [facility] Address:  [address]  [place], [place] [address]  

## 2023-09-11 NOTE — Transfer of Care (Signed)
Immediate Anesthesia Transfer of Care Note  Patient: Jason Cole  Procedure(s) Performed: CATARACT EXTRACTION PHACO AND INTRAOCULAR LENS PLACEMENT (IOC) (Left: Eye)  Patient Location: Short Stay  Anesthesia Type:MAC  Level of Consciousness: awake and patient cooperative  Airway & Oxygen Therapy: Patient Spontanous Breathing  Post-op Assessment: Report given to RN and Post -op Vital signs reviewed and stable  Post vital signs: Reviewed and stable  Last Vitals:  Vitals Value Taken Time  BP 130/82 09/11/23 1127  Temp 36.6 C 09/11/23 1127  Pulse 62 09/11/23 1127  Resp 16 09/11/23 1127  SpO2 100 % 09/11/23 1127    Last Pain:  Vitals:   09/11/23 1127  TempSrc: Oral  PainSc: 0-No pain      Patients Stated Pain Goal: 3 (09/11/23 1029)  Complications: No notable events documented.

## 2023-09-11 NOTE — Op Note (Signed)
Date of procedure: 09/11/23  Pre-operative diagnosis: Visually significant age-related nuclear cataract, Left Eye (H25.12)  Post-operative diagnosis: Visually significant age-related nuclear cataract, Left Eye  Procedure: Removal of cataract via phacoemulsification and insertion of intra-ocular lens Laural Benes and Johnson DIB00 +15.5D into the capsular bag of the Left Eye  Attending surgeon: Rudy Jew. Phoenix Dresser, MD, MA  Anesthesia: MAC, Topical Akten  Complications: None  Estimated Blood Loss: <66mL (minimal)  Specimens: None  Implants: As above  Indications:  Visually significant age-related cataract, Left Eye  Procedure:  The patient was seen and identified in the pre-operative area. The operative eye was identified and dilated.  The operative eye was marked.  Topical anesthesia was administered to the operative eye.     The patient was then to the operative suite and placed in the supine position.  A timeout was performed confirming the patient, procedure to be performed, and all other relevant information.   The patient's face was prepped and draped in the usual fashion for intra-ocular surgery.  A lid speculum was placed into the operative eye and the surgical microscope moved into place and focused.  An inferotemporal paracentesis was created using a 20 gauge paracentesis blade.  Shugarcaine was injected into the anterior chamber.  Viscoelastic was injected into the anterior chamber.  A temporal clear-corneal main wound incision was created using a 2.63mm microkeratome.  A continuous curvilinear capsulorrhexis was initiated using an irrigating cystitome and completed using capsulorrhexis forceps.  Hydrodissection and hydrodeliniation were performed.  Viscoelastic was injected into the anterior chamber.  A phacoemulsification handpiece and a chopper as a second instrument were used to remove the nucleus and epinucleus. The irrigation/aspiration handpiece was used to remove any remaining  cortical material.   The capsular bag was reinflated with viscoelastic, checked, and found to be intact.  The intraocular lens was inserted into the capsular bag.  The irrigation/aspiration handpiece was used to remove any remaining viscoelastic.  The clear corneal wound and paracentesis wounds were then hydrated and checked with Weck-Cels to be watertight. 0.38mL of moxifloxacin was injected into the anterior chamber. The lid-speculum and drape was removed, and the patient's face was cleaned with a wet and dry 4x4. A clear shield was taped over the eye. The patient was taken to the post-operative care unit in good condition, having tolerated the procedure well.  Post-Op Instructions: The patient will follow up at [facility] for a same day post-operative evaluation and will receive all other orders and instructions.

## 2023-09-11 NOTE — Anesthesia Postprocedure Evaluation (Signed)
Anesthesia Post Note  Patient: Jason Cole  Procedure(s) Performed: CATARACT EXTRACTION PHACO AND INTRAOCULAR LENS PLACEMENT (IOC) (Left: Eye)  Patient location during evaluation: PACU Anesthesia Type: MAC Level of consciousness: awake and alert Pain management: pain level controlled Vital Signs Assessment: post-procedure vital signs reviewed and stable Respiratory status: spontaneous breathing, nonlabored ventilation, respiratory function stable and patient connected to nasal cannula oxygen Cardiovascular status: stable and blood pressure returned to baseline Postop Assessment: no apparent nausea or vomiting Anesthetic complications: no   There were no known notable events for this encounter.   Last Vitals:  Vitals:   09/11/23 1029 09/11/23 1127  BP: 129/84 130/82  Pulse: 65 62  Resp: 16 16  Temp: 36.8 C 36.6 C  SpO2: 99% 100%    Last Pain:  Vitals:   09/11/23 1127  TempSrc: Oral  PainSc: 0-No pain                 Bryant Lipps L Khasir Woodrome

## 2023-09-12 DIAGNOSIS — E782 Mixed hyperlipidemia: Secondary | ICD-10-CM | POA: Diagnosis not present

## 2023-09-12 DIAGNOSIS — I10 Essential (primary) hypertension: Secondary | ICD-10-CM | POA: Diagnosis not present

## 2023-09-12 DIAGNOSIS — G4733 Obstructive sleep apnea (adult) (pediatric): Secondary | ICD-10-CM | POA: Diagnosis not present

## 2023-09-12 DIAGNOSIS — Z6831 Body mass index (BMI) 31.0-31.9, adult: Secondary | ICD-10-CM | POA: Diagnosis not present

## 2023-09-12 DIAGNOSIS — Z0001 Encounter for general adult medical examination with abnormal findings: Secondary | ICD-10-CM | POA: Diagnosis not present

## 2023-09-12 DIAGNOSIS — E6609 Other obesity due to excess calories: Secondary | ICD-10-CM | POA: Diagnosis not present

## 2023-09-12 DIAGNOSIS — E7849 Other hyperlipidemia: Secondary | ICD-10-CM | POA: Diagnosis not present

## 2023-09-13 DIAGNOSIS — K3189 Other diseases of stomach and duodenum: Secondary | ICD-10-CM | POA: Diagnosis not present

## 2023-10-03 DIAGNOSIS — Z96642 Presence of left artificial hip joint: Secondary | ICD-10-CM | POA: Diagnosis not present

## 2023-10-03 DIAGNOSIS — M1611 Unilateral primary osteoarthritis, right hip: Secondary | ICD-10-CM | POA: Diagnosis not present

## 2023-10-10 DIAGNOSIS — G4733 Obstructive sleep apnea (adult) (pediatric): Secondary | ICD-10-CM | POA: Diagnosis not present

## 2023-10-10 DIAGNOSIS — I10 Essential (primary) hypertension: Secondary | ICD-10-CM | POA: Diagnosis not present

## 2023-10-10 DIAGNOSIS — E6609 Other obesity due to excess calories: Secondary | ICD-10-CM | POA: Diagnosis not present

## 2023-10-10 DIAGNOSIS — T461X5A Adverse effect of calcium-channel blockers, initial encounter: Secondary | ICD-10-CM | POA: Diagnosis not present

## 2023-10-10 DIAGNOSIS — G894 Chronic pain syndrome: Secondary | ICD-10-CM | POA: Diagnosis not present

## 2023-10-10 DIAGNOSIS — Z6831 Body mass index (BMI) 31.0-31.9, adult: Secondary | ICD-10-CM | POA: Diagnosis not present

## 2023-10-10 DIAGNOSIS — M1611 Unilateral primary osteoarthritis, right hip: Secondary | ICD-10-CM | POA: Diagnosis not present

## 2023-10-10 DIAGNOSIS — E782 Mixed hyperlipidemia: Secondary | ICD-10-CM | POA: Diagnosis not present

## 2023-10-10 DIAGNOSIS — Z01818 Encounter for other preprocedural examination: Secondary | ICD-10-CM | POA: Diagnosis not present

## 2023-11-13 DIAGNOSIS — M1611 Unilateral primary osteoarthritis, right hip: Secondary | ICD-10-CM | POA: Diagnosis not present

## 2023-11-15 ENCOUNTER — Encounter (HOSPITAL_BASED_OUTPATIENT_CLINIC_OR_DEPARTMENT_OTHER): Payer: Self-pay | Admitting: Internal Medicine

## 2023-11-15 DIAGNOSIS — G4733 Obstructive sleep apnea (adult) (pediatric): Secondary | ICD-10-CM

## 2023-11-22 ENCOUNTER — Encounter (HOSPITAL_BASED_OUTPATIENT_CLINIC_OR_DEPARTMENT_OTHER): Payer: Self-pay | Admitting: Internal Medicine

## 2023-11-22 DIAGNOSIS — G4733 Obstructive sleep apnea (adult) (pediatric): Secondary | ICD-10-CM

## 2023-11-24 ENCOUNTER — Encounter (HOSPITAL_BASED_OUTPATIENT_CLINIC_OR_DEPARTMENT_OTHER): Payer: Self-pay

## 2023-11-24 ENCOUNTER — Ambulatory Visit (HOSPITAL_BASED_OUTPATIENT_CLINIC_OR_DEPARTMENT_OTHER): Payer: Medicare HMO | Attending: Family Medicine | Admitting: Internal Medicine

## 2023-11-24 ENCOUNTER — Ambulatory Visit (HOSPITAL_BASED_OUTPATIENT_CLINIC_OR_DEPARTMENT_OTHER): Payer: Medicare HMO | Admitting: Internal Medicine

## 2023-11-24 VITALS — Ht 71.0 in | Wt 220.0 lb

## 2023-11-24 DIAGNOSIS — G4761 Periodic limb movement disorder: Secondary | ICD-10-CM | POA: Diagnosis not present

## 2023-11-24 DIAGNOSIS — I493 Ventricular premature depolarization: Secondary | ICD-10-CM | POA: Insufficient documentation

## 2023-11-24 DIAGNOSIS — G4733 Obstructive sleep apnea (adult) (pediatric): Secondary | ICD-10-CM | POA: Insufficient documentation

## 2023-12-02 DIAGNOSIS — G4733 Obstructive sleep apnea (adult) (pediatric): Secondary | ICD-10-CM

## 2023-12-02 NOTE — Procedures (Signed)
Patient Name: Jason Cole, [place] Date: 11/24/2023 Gender: Male D.O.B: 1956/01/19 Age (years): 67 Referring Provider: Corrie Mckusick Height (inches): 71 Interpreting Physician: Jetty Duhamel MD, ABSM Weight (lbs): 220 RPSGT: Lowry Ram BMI: 31 MRN: [id number] Neck Size: 17.00  CLINICAL INFORMATION Sleep Study Type: NPSG Indication for sleep study: Fatigue, Hypertension, Morning Headaches, Obesity, Re-Evaluation, Snoring Epworth Sleepiness Score: 1  SLEEP STUDY TECHNIQUE As per the AASM Manual for the Scoring of Sleep and Associated Events v2.3 (April 2016) with a hypopnea requiring 4% desaturations.  The channels recorded and monitored were frontal, central and occipital EEG, electrooculogram (EOG), submentalis EMG (chin), nasal and oral airflow, thoracic and abdominal wall motion, anterior tibialis EMG, snore microphone, electrocardiogram, and pulse oximetry.  MEDICATIONS Medications self-administered by patient taken the night of the study : none reported  SLEEP ARCHITECTURE The study was initiated at 10:19:50 PM and ended at 5:00:00 AM.  Sleep onset time was 24.1 minutes and the sleep efficiency was 51.5%. The total sleep time was 206 minutes.  Stage REM latency was 108.5 minutes.  The patient spent 11.4% of the night in stage N1 sleep, 66.3% in stage N2 sleep, 0.0% in stage N3 and 22.3% in REM.  Alpha intrusion was absent.  Supine sleep was 100.00%.  RESPIRATORY PARAMETERS The overall apnea/hypopnea index (AHI) was 10.8 per hour. There were 3 total apneas, including 3 obstructive, 0 central and 0 mixed apneas. There were 34 hypopneas and 35 RERAs.  The AHI during Stage REM sleep was 39.1 per hour.  AHI while supine was 10.8 per hour.  The mean oxygen saturation was 95.7%. The minimum SpO2 during sleep was 79.0%.  moderate snoring was noted during this study.  CARDIAC DATA The 2 lead EKG demonstrated sinus rhythm. The mean heart rate was 56.7  beats per minute. Other EKG findings include: PVCs.  LEG MOVEMENT DATA The total PLMS were 0 with a resulting PLMS index of 0.0. Associated arousal with leg movement index was 7.6 .  IMPRESSIONS - Mild obstructive sleep apnea occurred during this study (AHI = 10.8/h). - Insufficient early sleepand events to meet protocol requirements for split CPAP titration. - Moderate oxygen desaturation was noted during this study (Min O2 = 79.0%, Mean 95.7%). - The patient snored with moderate snoring volume. - EKG findings include PVCs. - Total limb movements 64 (18.6/hr). Limb movements with arousal/ awakening 26 ( 7.6/hr). - Tech commented patient didn't sleep well because he is used to sleeping with CPAP. 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 Other options would be based on clinical judgment. - Consider trial of pregabalin, Mirapex, Requip, or Sinemet for treatment of Periodic Leg Movements of Sleep. - Consider oral bite guard for bruxism - Be careful with alcohol, sedatives and other CNS depressants that may worsen sleep apnea and disrupt normal sleep architecture. - Sleep hygiene should be reviewed to assess factors that may improve sleep quality. - Weight management and regular exercise should be initiated or continued if appropriate.  [Electronically signed] 12/02/2023 01:32 PM  Jetty Duhamel MD, ABSM Diplomate, American Board of Sleep Medicine NPI: [id number]                         Jetty Duhamel Diplomate, American Board of Sleep Medicine  ELECTRONICALLY SIGNED ON:  12/02/2023, 1:25 PM [facility] PH: [phone]  FX: [phone] ACCREDITED BY THE [employer]ICAN ACADEMY OF SLEEP MEDICINE

## 2023-12-08 DIAGNOSIS — M25751 Osteophyte, right hip: Secondary | ICD-10-CM | POA: Diagnosis not present

## 2023-12-08 DIAGNOSIS — M1611 Unilateral primary osteoarthritis, right hip: Secondary | ICD-10-CM | POA: Diagnosis not present

## 2023-12-22 DIAGNOSIS — Z471 Aftercare following joint replacement surgery: Secondary | ICD-10-CM | POA: Diagnosis not present

## 2023-12-22 DIAGNOSIS — Z96641 Presence of right artificial hip joint: Secondary | ICD-10-CM | POA: Diagnosis not present

## 2024-01-22 DIAGNOSIS — Z471 Aftercare following joint replacement surgery: Secondary | ICD-10-CM | POA: Diagnosis not present

## 2024-01-22 DIAGNOSIS — Z96641 Presence of right artificial hip joint: Secondary | ICD-10-CM | POA: Diagnosis not present

## 2024-02-19 DIAGNOSIS — I10 Essential (primary) hypertension: Secondary | ICD-10-CM | POA: Diagnosis not present

## 2024-02-19 DIAGNOSIS — E6609 Other obesity due to excess calories: Secondary | ICD-10-CM | POA: Diagnosis not present

## 2024-02-19 DIAGNOSIS — Z6831 Body mass index (BMI) 31.0-31.9, adult: Secondary | ICD-10-CM | POA: Diagnosis not present

## 2024-02-19 DIAGNOSIS — E782 Mixed hyperlipidemia: Secondary | ICD-10-CM | POA: Diagnosis not present

## 2024-02-19 DIAGNOSIS — Z0001 Encounter for general adult medical examination with abnormal findings: Secondary | ICD-10-CM | POA: Diagnosis not present

## 2024-02-19 DIAGNOSIS — E7849 Other hyperlipidemia: Secondary | ICD-10-CM | POA: Diagnosis not present

## 2024-02-19 DIAGNOSIS — Z1331 Encounter for screening for depression: Secondary | ICD-10-CM | POA: Diagnosis not present

## 2024-05-01 DIAGNOSIS — Z0000 Encounter for general adult medical examination without abnormal findings: Secondary | ICD-10-CM | POA: Diagnosis not present

## 2024-05-01 DIAGNOSIS — Z024 Encounter for examination for driving license: Secondary | ICD-10-CM | POA: Diagnosis not present

## 2024-05-01 DIAGNOSIS — I10 Essential (primary) hypertension: Secondary | ICD-10-CM | POA: Diagnosis not present

## 2024-05-01 DIAGNOSIS — Z6832 Body mass index (BMI) 32.0-32.9, adult: Secondary | ICD-10-CM | POA: Diagnosis not present

## 2024-05-01 DIAGNOSIS — G4733 Obstructive sleep apnea (adult) (pediatric): Secondary | ICD-10-CM | POA: Diagnosis not present

## 2024-05-01 DIAGNOSIS — E782 Mixed hyperlipidemia: Secondary | ICD-10-CM | POA: Diagnosis not present

## 2024-05-01 DIAGNOSIS — E6609 Other obesity due to excess calories: Secondary | ICD-10-CM | POA: Diagnosis not present

## 2024-05-01 DIAGNOSIS — T461X5A Adverse effect of calcium-channel blockers, initial encounter: Secondary | ICD-10-CM | POA: Diagnosis not present

## 2024-05-14 DIAGNOSIS — Z96641 Presence of right artificial hip joint: Secondary | ICD-10-CM | POA: Diagnosis not present

## 2024-06-04 NOTE — Procedures (Signed)
 SABRA

## 2025-01-27 ENCOUNTER — Ambulatory Visit: Payer: Self-pay
